# Patient Record
Sex: Female | Born: 1945 | Hispanic: Yes | Smoking: Never smoker
Health system: Southern US, Community
[De-identification: ages and names within clinical notes are randomized; demographics above are authoritative.]

## PROBLEM LIST (undated history)

## (undated) DIAGNOSIS — I10 Essential (primary) hypertension: Secondary | ICD-10-CM

## (undated) DIAGNOSIS — E785 Hyperlipidemia, unspecified: Secondary | ICD-10-CM

## (undated) DIAGNOSIS — M810 Age-related osteoporosis without current pathological fracture: Secondary | ICD-10-CM

## (undated) HISTORY — DX: Age-related osteoporosis without current pathological fracture: M81.0

## (undated) HISTORY — DX: Hyperlipidemia, unspecified: E78.5

## (undated) HISTORY — PX: BREAST EXCISIONAL BIOPSY: SUR124

## (undated) HISTORY — DX: Essential (primary) hypertension: I10

---

## 2016-12-23 ENCOUNTER — Other Ambulatory Visit: Payer: Self-pay | Admitting: Internal Medicine

## 2016-12-23 DIAGNOSIS — Z1231 Encounter for screening mammogram for malignant neoplasm of breast: Secondary | ICD-10-CM

## 2016-12-24 ENCOUNTER — Ambulatory Visit
Admission: RE | Admit: 2016-12-24 | Discharge: 2016-12-24 | Disposition: A | Discharge status: Home / Self Care | Payer: Medicare Other | Source: Ambulatory Visit | Attending: Internal Medicine | Admitting: Internal Medicine

## 2016-12-24 DIAGNOSIS — Z1231 Encounter for screening mammogram for malignant neoplasm of breast: Secondary | ICD-10-CM

## 2016-12-29 ENCOUNTER — Ambulatory Visit: Payer: Self-pay

## 2017-02-04 ENCOUNTER — Ambulatory Visit (INDEPENDENT_AMBULATORY_CARE_PROVIDER_SITE_OTHER): Payer: Medicare Other | Admitting: Neurology

## 2017-02-04 ENCOUNTER — Encounter (INDEPENDENT_AMBULATORY_CARE_PROVIDER_SITE_OTHER): Payer: Self-pay

## 2017-02-04 ENCOUNTER — Encounter: Payer: Self-pay | Admitting: Neurology

## 2017-02-04 VITALS — BP 173/75 | HR 77 | Ht 60.0 in | Wt 142.8 lb

## 2017-02-04 DIAGNOSIS — R413 Other amnesia: Secondary | ICD-10-CM | POA: Diagnosis not present

## 2017-02-04 DIAGNOSIS — R51 Headache: Secondary | ICD-10-CM | POA: Diagnosis not present

## 2017-02-04 DIAGNOSIS — G44229 Chronic tension-type headache, not intractable: Secondary | ICD-10-CM | POA: Diagnosis not present

## 2017-02-04 DIAGNOSIS — G43109 Migraine with aura, not intractable, without status migrainosus: Secondary | ICD-10-CM | POA: Diagnosis not present

## 2017-02-04 DIAGNOSIS — G459 Transient cerebral ischemic attack, unspecified: Secondary | ICD-10-CM

## 2017-02-04 DIAGNOSIS — R419 Unspecified symptoms and signs involving cognitive functions and awareness: Secondary | ICD-10-CM | POA: Diagnosis not present

## 2017-02-04 DIAGNOSIS — R519 Headache, unspecified: Secondary | ICD-10-CM

## 2017-02-04 NOTE — Patient Instructions (Signed)
MRI of the brain Echocardiogram and carotid dopplers [facility] (at primary care 10/15)   Cefalea migraosa (Migraine Headache) Neomia Dear cefalea migraosa es un dolor intenso y punzante en uno o ambos lados de la cabeza. Las migraas tambin pueden causar otros sntomas, como nuseas, vmitos y sensibilidad a la luz y el ruido. CAUSAS Hay ciertos factores que pueden provocar migraas, como los siguientes:  Alcohol.  Fumar.  Medicamentos, por ejemplo: ? Medicamentos para Engineer, materials torcico (nitroglicerina). ? Pldoras anticonceptivas. ? Comprimidos de estrgeno. ? Ciertos medicamentos para la presin arterial.  Quesos curados, chocolate o cafena.  Los alimentos o las bebidas que contienen nitratos, glutamato, aspartamo o tiramina.  Realizar actividad fsica. Otros factores que pueden provocar migraa incluyen los siguientes:  Menstruacin.  Embarazo.  Hambre.  Estrs, poco o demasiado sueo, o fatiga.  Cambios climticos. FACTORES DE RIESGO Los siguientes factores pueden hacer que usted sea ms propenso a tener migraas:  La edad. Los riesgos aumentan con la edad.  Antecedentes familiares de migraa.  Ser de Engineer, manufacturing.  Depresin y ansiedad.  Obesidad.  Ser mujer.  Tener un agujero en el corazn (persistencia del agujero oval) u otros problemas cardacos. SNTOMAS El principal sntoma de esta afeccin es el dolor intenso y punzante. El dolor:  Puede aparecer en cualquier regin de la cabeza, tanto de un lado como de [place].  Puede interferir con las actividades de la vida cotidiana.  Puede empeorar con la actividad fsica.  Puede empeorar ante la exposicin a luces brillantes o a ruidos fuertes. Otros sntomas pueden incluir lo siguiente:  Nuseas.  Vmitos.  Mareos.  Sensibilidad general a las luces brillantes, a los ruidos fuertes o a los [employer]. Antes de sufrir una migraa, puede percibir seales de advertencia (aura). Un aura puede  incluir:  Ver luces intermitentes o tener puntos ciegos.  Ver puntos brillantes, halos o lneas en zigzag.  Tener una visin en tnel o visin borrosa.  Sentir entumecimiento u hormigueo.  Tener dificultad para hablar.  Debilidad muscular. DIAGNSTICO La cefalea migraosa se diagnostica en funcin de lo siguiente:  Sus sntomas.  Un examen fsico.  Estudios, como una tomografa computarizada o una resonancia magntica de la cabeza. Estos estudios de diagnstico por imgenes pueden ayudar a Teacher, early years/pre causas de cefalea.  Lauris Poag de lquido cefalorraqudeo (puncin lumbar) para Chiropractor (anlisis de lquido cefalorraqudeo o anlisis de LCR). TRATAMIENTO Las cefaleas migraosas suelen tratarse con medicamentos que:  Associate Professor.  Alivian las nuseas.  Evitan la recurrencia de las migraas. El tratamiento tambin puede incluir lo siguiente:  Acupuntura.  Cambios en el estilo de vida, como evitar alimentos que provoquen Cerritos. INSTRUCCIONES PARA EL CUIDADO EN EL HOGAR Medicamentos  B[employer] International de venta libre y los recetados solamente como se lo haya indicado el mdico.  No conduzca ni use maquinaria pesada mientras toma analgsicos recetados.  A fin de prevenir o tratar el estreimiento mientras toma analgsicos recetados, el mdico puede recomendarle lo siguiente: ? Beba suficiente lquido para mantener la orina clara o de color amarillo plido. ? Tomar medicamentos recetados o de [employer]. ? Consumir alimentos ricos en fibra, como frutas y verduras frescas, cereales integrales y frijoles. ? Limitar el consumo de alimentos con alto contenido de grasas y azcares procesados, como alimentos fritos o dulces. Estilo de vida  Evite el consumo de alcohol.  No consuma ningn producto que contenga nicotina o tabaco, como cigarrillos y Administrator, Civil Service. Si necesita ayuda para dejar de fumar, consulte  al mdico.  Duerma como mnimo 8horas  todas las noches.  Evite las situaciones de estrs. Instrucciones generales  Lleve un registro diario para Financial risk analyst lo que Advice worker. Por ejemplo, escriba: ? Lo que usted come y bebe. ? Cunto tiempo duerme. ? Algn cambio en su dieta o en los medicamentos.  Si tiene una migraa: ? Evite los factores que [employer] sntomas, como las luces brillantes. ? Resulta til acostarse en una habitacin oscura y silenciosa. ? No conduzca vehculos ni opere maquinaria pesada. ? Pregntele al mdico qu actividades son seguras para usted cuando tiene sntomas.  Concurra a todas las visitas de control como se lo haya indicado el mdico. Esto es importante. SOLICITE ATENCIN MDICA SI:  Tiene sntomas de migraa distintos o ms intensos que los habituales.  SOLICITE ATENCIN MDICA DE INMEDIATO SI:  La migraa se hace cada vez ms intensa.  Tiene fiebre.  Presenta rigidez en el cuello.  Tiene prdida de visin.  Siente debilidad en los msculos o que no puede controlarlos.  Comienza a perder el equilibrio con frecuencia.  Comienza a tener dificultades para caminar.  Se desmaya.  Esta informacin no tiene Theme park manager el consejo del mdico. Asegrese de hacerle al mdico cualquier pregunta que tenga. Document Released: 04/20/2005 Document Revised: 02/08/2013 Document Reviewed: 10/07/2015 Elsevier Interactive Patient Education  2017 Elsevier Inc.  Amnesia global transitoria (Transient Global Amnesia) La amnesia global transitoria causa una prdida de la memoria (amnesia) repentina y temporal (transitoria). A pesar de que puede tener recuerdos de su pasado lejano, que incluyen el reconocimiento de personas a las que conoce bien, es posible que no recuerde eventos ms recientes que sucedieron en los ltimos [place], [place] e incluso en el ao anterior. Un episodio de amnesia global transitoria no dura ms de 24 horas. La amnesia global transitoria no  afecta sus otras funciones cerebrales. La memoria generalmente vuelve a la normalidad despus de que finaliza un episodio. Un episodio de amnesia global transitoria no implica que tiene ms probabilidades de sufrir un ictus, una recada u otras complicaciones. CAUSAS Se desconoce la causa de esta afeccin. FACTORES DE RIESGO Hay mayores probabilidades de que la amnesia global transitoria se desarrolle en personas que:  Tienen entre 50 y 11aos.  Tienen antecedentes de migraa. SNTOMAS Los principales sntomas de esta afeccin incluyen lo siguiente:  La incapacidad de recordar eventos recientes.  Realizar preguntar reiteradas sobre la situacin y el contexto, y no recordar las respuestas a estas preguntas. Otros sntomas pueden ser los siguientes:  Agitacin y nerviosismo.  Confusin.  Dolores de [place].  Mareos.  Nuseas. DIAGNSTICO El mdico puede sospechar esta afeccin segn los sntomas. El [employer] har un examen fsico. Este puede incluir una prueba para comprobar sus habilidades mentales (evaluacin cognitiva). Tambin pueden realizarle estudios por imgenes para controlar las funciones cerebrales. Estos pueden incluir lo siguiente:  Electroencefalografa (EEG).  Imgenes ponderadas por difusin (IPD).  Resonancia magntica. TRATAMIENTO No hay tratamiento para esta afeccin. Un episodio generalmente desaparece por s solo despus de unas horas. Si, durante un episodio, tambin sufre una convulsin o [place], recibir tratamiento para estos sntomas, que puede incluir medicamentos. INSTRUCCIONES PARA EL CUIDADO EN EL HOGAR  Tome los medicamentos solamente como se lo haya indicado el mdico.  Informe a su familia o amigos que tiene amnesia global transitoria. Pdales que le ayuden a Human resources officer fsico, que incluye tener relaciones sexuales, nadar y Education officer, environmental esfuerzos mientras contiene la respiracin (maniobra de [place]), [employer]  que finalice el episodio. Estos  eventos pueden desencadenar crisis de amnesia global transitoria. SOLICITE ATENCIN MDICA SI:  Aram Candela y esta no desaparece despus de que haya seguido el plan de tratamiento para esta afeccin.  Tiene una convulsin por primera vez o una convulsin diferente a las que normalmente padece.  Tiene amnesia global transitoria en reiteradas ocasiones. Esta informacin no tiene Theme park manager el consejo del mdico. Asegrese de hacerle al mdico cualquier pregunta que tenga. Document Released: 04/20/2005 Document Revised: 05/11/2014 Document Reviewed: 01/03/2014 Elsevier Interactive Patient Education  2018 [employer].     Ictus isqumico (Ischemic Stroke) Un ictus isqumico (accidente cerebrovascular o ACV) es la muerte repentina de tejido cerebral que ocurre cuando el oxgeno no llega a una zona del cerebro. Es una emergencia mdica que debe tratarse de inmediato. Un ictus isqumico puede causar una prdida permanente de la actividad cerebral. Esto puede causar problemas con el funcionamiento de diferentes partes del cuerpo. CAUSAS Esta afeccin es causada por la falta de oxgeno en una zona del cerebro, que puede ser el resultado de lo siguiente:  Un pequeo cogulo sanguneo (mbolos) o una acumulacin de placas en los vasos sanguneos (ateroesclerosis) que bloqueen el torrente sanguneo en el cerebro.  Ritmo cardaco anormal (fibrilacin auricular).  Una arteria obstruida o daada en la cabeza o el cuello. FACTORES DE RIESGO Hay ciertos factores que pueden hacer que sea ms propenso a sufrir esta afeccin. Algunos de [employer] puede [place], como por ejemplo:  [place].  Fumar cigarrillos.  Tomar anticonceptivos por va oral, en especial si consume tabaco.  Falta de actividad fsica.  Consumo excesivo de bebidas alcohlicas.  Consumo de drogas ilegales, especialmente cocana y metanfetamina. Otros factores de riesgo son los siguientes:  Presin  arterial elevada (hipertensin arterial).  Colesterol elevado.  Diabetes mellitus.  Cardiopata coronaria.  Ser afroamericano, norteamericano nativo, hispano o nativo de [place].  Ser mayor de 56aos.  Tener antecedentes familiares de ictus.  Tener antecedentes de cogulos sanguneos, ictus o ataques isqumicos transitorios (AIT).  Anemia drepanoctica.  Ser mujer con antecedentes de preeclampsia.  Cefalea migraosa.  Apnea del sueo.  Tener un ritmo cardaco irregular, como fibrilacin auricular.  Tener enfermedades inflamatorias crnicas, como artritis reumatoide o lupus.  Trastornos de Control and instrumentation engineer). SIGNOS Y SNTOMAS Los sntomas de esta afeccin, por lo general, aparecen de forma repentina, o puede notarlos despus de despertarse. Entre los sntomas repentinos se pueden incluir los siguientes:  Debilidad o adormecimiento de la cara, el brazo o la pierna, especialmente en un lado del cuerpo.  Dificultad para caminar, o para mover los brazos o las piernas.  Prdida del equilibrio o de la coordinacin.  Confusin.  Habla arrastrada (disartria).  Dificultad para hablar o comprender el lenguaje, o ambas (afasia).  Cambios en la visin (como visin doble, visin borrosa o prdida de la visin) enuno o ambos ojos.  Mareos.  Nuseas y vmitos.  Dolor de cabeza intenso sin causa aparente. Dolor de cabeza que generalmente se describe Curator de cabeza que haya sufrido. En lo posible, tome nota de la hora exacta en la que se sinti normal por ltima vez y de la hora en que comenzaron los sntomas. Infrmele a su mdico. Si los sntomas van y vienen, esto podra ser un signo de ictus de advertencia o AIT. Busque ayuda de inmediato, incluso si se siente mejor. DIAGNSTICO Esta afeccin se puede diagnosticar en funcin de lo siguiente:  Los sntomas, sus antecedentes mdicos y  un examen fsico.  Tomografa computarizada (TC) del  cerebro.  Resonancia magntica (RM).  Angiografa por tomografa computarizada (TC). En esta prueba, se Beryle Quant computadora para tomarle radiografas de las arterias. Pueden inyectarle un colorante en la sangre para observar el interior de los vasos sanguneos con ms claridad.  Angiografa por resonancia magntica (RM). Se trata de un tipo de resonancia magntica que se [place] para estudiar los vasos sanguneos.  Angiografa cerebral. En este estudio se utilizan rayosX y un colorante para observar los vasos sanguneos del cerebro y el cuello. Tal vez deba consultar a un mdico especialista en ictus. Puede consultar a Environmental manager, por telfono o mediante tecnologa a distancia (telemedicina). Se pueden realizar otros estudios para hallar la causa del ictus, que pueden incluir los siguientes:  Materials engineer (ECG).  Monitorizacin electrocardiogrfica continua.  Ecocardiograma.  Ecografa de la cartida.  Estudio de la circulacin cerebral.  Anlisis de [place].  Estudio del sueo para verificar si tiene apnea del sueo. TRATAMIENTO El tratamiento de esta afeccin depender de la duracin, la gravedad y la causa de los sntomas, y de la zona del cerebro afectada. Es muy importante recibir tratamiento tras aparecer los primeros sntomas del ictus. Algunos tratamientos resultan ms eficaces si se realizan en el plazo de las 3 a 6horas del comienzo de los sntomas del ictus. Estos tratamientos pueden incluir los siguientes:  Aspirina.  Medicamentos para controlar la presin arterial.  Un medicamento inyectable para disolver el cogulo sanguneo (tromboltico).  Tratamientos aplicados directamente en la arteria afectada para eliminar o disolver el cogulo sanguneo. Otras opciones de tratamiento son las siguientes:  Oxgeno.  Lquidos por va IV.  Medicamentos para diluir la sangre (anticoagulantes o inhibidores plaquetarios).  Procedimientos para aumentar  el flujo sanguneo. La administracin de medicamentos y los cambios en la dieta pueden ayudar a tratar y Chief Operating Officer los factores de riesgo de ictus, como la diabetes, el colesterol alto y la hipertensin arterial. Despus de un ictus, puede trabajar con fisioterapeutas, fonoaudilogos o terapeutas ocupacionales para ayudarlo a recuperarse. INSTRUCCIONES PARA EL CUIDADO EN EL HOGAR Medicamentos  [employer] de venta libre y los recetados solamente como se lo haya indicado el mdico.  Si le indicaron que tomara medicamentos para diluir la sangre, como aspirinas o anticoagulantes, tmelos exactamente como se lo haya indicado el mdico. ? El exceso de anticoagulantes puede causar hemorragias. ? Si no toma la cantidad suficiente, no contar con la proteccin que necesita contra otro ictus y [employer].  Conozca los efectos secundarios de tomar anticoagulantes. Al tomar este tipo de medicamentos, asegrese de lo siguiente: ? Occupational hygienist presin sobre las heridas por ms tiempo que lo habitual. ? Informe a su dentista y otros mdicos que toma anticoagulantes antes de que le realicen alguna intervencin quirrgica que pueda causar hemorragias. ? Evite realizar actividades que puedan causarle traumatismos o lesiones. Comida y bebida  Siga las indicaciones del mdico acerca de la dieta.  Consuma alimentos saludables.  Si el ictus afect su capacidad para tragar, es posible que necesite tomar medidas para no ahogarse, como las siguientes: ? Comer de a porciones pequeas. ? Comer comidas blandas o en pur. Seguridad  Siga las indicaciones del equipo mdico con respecto a la actividad fsica.  Use un andador o un bastn como se lo haya indicado el mdico.  Tome medidas para crear un entorno seguro en su casa a fin de reducir el riesgo de cadas. Esto puede incluir lo siguiente: ? Hacer que profesionales inspeccionen  su casa. ? Colocar barras para sostn en la habitacin y el  bao. ? Colocar inodoros elevados y un asiento en la ducha como medidas de seguridad. Instrucciones generales  No consuma ningn producto que contenga tabaco, lo que incluye cigarrillos, tabaco de Theatre manager y Administrator, Civil Service. Si necesita ayuda para dejar de fumar, consulte al mdico.  Limite el consumo de alcohol a no ms de 1 medida por da si es mujer y no est Orthoptist, y 2 medidas por da si es hombre. Una medida equivale a 12onzas de cerveza, 5onzas de vino o 1onzas de bebidas alcohlicas de alta graduacin.  Si necesita ayuda para dejar de consumir drogas o alcohol, pdale al mdico que le recomiende un programa o que lo derive a Music therapist.  Mantenga un estilo de vida activo y saludable. Haga actividad fsica habitualmente como se lo haya indicado el mdico.  Acuda a todas las consultas de control como se lo haya indicado el mdico, incluidas las consultas con todos los especialistas de su equipo mdico. Esto es importante. PREVENCIN El riesgo de sufrir otro ictus puede disminuir al tratar, de manera adecuada, la hipertensin arterial, el colesterol alto, la diabetes, las cardiopatas coronarias, la apnea del sueo y la obesidad. Tambin puede disminuir si deja de fumar, limita el consumo de alcohol y se Audiological scientist. El mdico trabajar con usted para tomar medidas a fin de evitar las complicaciones del ictus a corto y a Air cabin crew. SOLICITE ATENCIN MDICA DE INMEDIATO SI: Tiene los siguientes sntomas:  Tiene debilidad o adormecimiento sbito en el rostro, el brazo o la pierna, especialmente en un lado del cuerpo.  Confusin sbita.  Dificultad repentina para hablar o comprender el lenguaje, o ambas (afasia).  Dificultad repentina para ver con uno o ambos ojos.  Dificultad repentina para caminar o para mover los brazos o las piernas.  Mareos repentinos.  Prdida repentina del equilibrio o de la coordinacin.  Dolor de cabeza sbito e  intenso sin causa aparente.  Prdida parcial o total del conocimiento.  Convulsiones. Cualquiera de estos sntomas puede representar un problema grave y es Radio broadcast assistant. No espere hasta que los sntomas desaparezcan. Solicite atencin mdica de inmediato. Comunquese con el servicio de emergencias de su localidad (911 en los [place]). No conduzca por sus propios medios [employer]. Esta informacin no tiene Theme park manager el consejo del mdico. Asegrese de hacerle al mdico cualquier pregunta que tenga. Document Released: 01/28/2005 Document Revised: 05/11/2014 Document Reviewed: 07/17/2015 Elsevier Interactive Patient Education  2017 [employer].  [employer] adultos (Seizure, Adult) Neomia Dear convulsin es una rfaga repentina de actividad elctrica anormal en el cerebro. Esta actividad anormal interrumpe el funcionamiento normal del cerebro de forma temporal, y la persona puede experimentar cualquiera de los siguientes signos:  Movimientos involuntarios.  Cambios en el estado de alerta o la conciencia.  Sacudidas incontrolables (convulsiones). Generalmente duran entre 30 segundos y 2 minutos. No suelen causar un dao permanente en el cerebro, salvo que se prolonguen. Qu puede provocar una convulsin? La convulsiones pueden ocurrir por [employer], que incluyen lo siguiente:  Grant Ruts.  Bajo nivel de Banker.  Un medicamento.  Una enfermedad.  Una lesin cerebral. Algunas personas que tienen una convulsin nunca ms tienen [place]. Las personas que tienen convulsiones reiteradas, tienen una afeccin llamada epilepsia. Cules son los sntomas de una convulsin? Los sntomas de una convulsin varan mucho de Neomia Dear persona a Educational psychologist. Estos incluyen los siguientes:  Espasmos.  El  cuerpo se entumece.  Movimientos involuntarios de los brazos o las piernas.  Prdida del conocimiento.  Problemas respiratorios.  Cada  repentina.  Confusin.  Movimientos de asentimiento con la cabeza.  Parpadeo o movimientos de abrir y Retail buyer ojos.  Chasquido de labios.  Babeo.  Movimientos rpidos de los ojos.  Gruidos.  Prdida del control del intestino y de la vejiga.  Mirar fijamente.  Falta de [place]. Algunas personas tienen sntomas apenas antes de que ocurra una convulsin (aura) e inmediatamente despus de la convulsin. Los sntomas del aura incluyen los siguientes:  [place] o ansiedad.  Nuseas.  Sentir que la habitacin da vueltas (vrtigo).  Una sensacin de haber visto o escuchado algo antes (deja vu).  Sabores u [employer].  Cambios en la visin, como ver destellos de luz o [place]. Los sntomas que pueden ocurrir despus de una convulsin incluyen los siguientes:  Confusin.  Somnolencia.  Dolor de [place].  Debilidad en un lado del cuerpo. INSTRUCCIONES PARA EL CUIDADO EN EL HOGAR Medicamentos  Baxter International de venta libre y los recetados solamente como se lo haya indicado el mdico.  Evite cualquier sustancia que pueda interferir con su medicamento, como el alcohol. Actividad  No conduzca, nade ni haga ninguna otra actividad que pueda ser peligrosa si tuvo otra convulsin. Espere hasta que el mdico lo autorice.  Si vive en los [place], consulte al Departamento de [employer] Motorizados local para averiguar sobre las leyes de trnsito locales. Cada estado tiene normas especficas sobre cundo puede volver a Public house manager.  Descanse lo suficiente. La falta de sueo puede aumentar la probabilidad de sufrir convulsiones. Educar a los [employer] a sus amigos y familiares lo que deben hacer si tiene una convulsin. Ellos deben:  Acostarlo en el suelo para evitar que se caiga.  Colocar un almohadn debajo de su cabeza y cuerpo.  Aflojar la ropa apretada alrededor de su cuello.  Recostarlo sobre un lado. En caso de tener vmitos, esto ayuda a  [employer] vas areas despejadas.  Lennie Hummer con usted hasta que se recupere.  No sostenerlo presionando hacia abajo. Hacer esto no detendr la convulsin.  No colocarle nada en la boca.  Saber si necesita atencin de emergencia o no. Instrucciones generales  Comunquese con el mdico cada vez que tenga una convulsin.  Evite cualquier cosa que le haya desencadenado una convulsin.  Lleve un diario de sus convulsiones. Registre lo que recuerde [employer], en especial, cualquier cosa que pueda haber desencadenado la convulsin.  Concurra a todas las visitas de control como se lo haya indicado el mdico. Esto es importante. SOLICITE ATENCIN MDICA SI:  Tiene otra convulsin.  Tiene convulsiones con mayor frecuencia.  Los sntomas de sus convulsiones [place].  Contina teniendo convulsiones despus con Scientist, research (medical).  Tiene sntomas de alguna infeccin o enfermedad. Esto podra aumentar el riesgo de tener una convulsin.  SOLICITE ATENCIN MDICA DE INMEDIATO SI:  Tiene una convulsin con las siguientes caractersticas: ? Dura ms de . ? Es diferente de las [employer]. ? No le permite hablar ni usar una parte de su cuerpo. ? Dificulta la respiracin. ? Ocurre despus de una lesin en la cabeza.  Tiene los siguientes sntomas: ? Varias convulsiones consecutivas. ? Confusin o dolor de cabeza intenso inmediatamente despus de una convulsin.  Tiene convulsiones con mayor frecuencia.  No se despierta de inmediato despus de una convulsin.  Se lesiona durante una convulsin. Estos sntomas pueden representar un problema grave que constituye  una emergencia. No espere hasta que los sntomas desaparezcan. Solicite atencin mdica de inmediato. Comunquese con el servicio de emergencias de su localidad (911 en los [place]). No conduzca por sus propios medios [employer]. Esta informacin no tiene Theme park manager el consejo  del mdico. Asegrese de hacerle al mdico cualquier pregunta que tenga. Document Released: 01/28/2005 Document Revised: 05/11/2014 Document Reviewed: 11/22/2015 Elsevier Interactive Patient Education  2017 [employer].   Ischemic Stroke An ischemic stroke (cerebrovascular accident, or CVA) is the sudden death of brain tissue that occurs when an area of the brain does not get enough oxygen. It is a medical emergency that must be treated right away. An ischemic stroke can cause permanent loss of brain function. This can cause problems with how different parts of your body function. What are the causes? This condition is caused by a decrease of oxygen supply to an area of the brain, which may be the result of:  A small blood clot (embolus) or a buildup of plaque in the blood vessels (atherosclerosis) that blocks blood flow in the brain.  An abnormal heart rhythm (atrial fibrillation).  A blocked or damaged artery in the head or neck.  What increases the risk? Certain factors may make you more likely to develop this condition. Some of these factors are things that you can change, such as:  Obesity.  Smoking cigarettes.  Taking oral birth control, especially if you also use tobacco.  Physical inactivity.  Excessive alcohol use.  Use of illegal drugs, especially cocaine and methamphetamine.  Other risk factors include:  High blood pressure (hypertension).  High cholesterol.  Diabetes mellitus.  Heart disease.  Being [place] American, Native [address], Hispanic, or [place] Native.  Being over age 69.  Family history of stroke.  Previous history of blood clots, stroke, or transient ischemic attack (TIA).  Sickle cell disease.  Being a woman with a history of preeclampsia.  Migraine headache.  Sleep apnea.  Irregular heartbeats, such as atrial fibrillation.  Chronic inflammatory diseases, such as rheumatoid arthritis or lupus.  Blood clotting disorders  (hypercoagulable state).  What are the signs or symptoms? Symptoms of this condition usually develop suddenly, or you may notice them after waking up from sleep. Symptoms may include sudden:  Weakness or numbness in your face, arm, or leg, especially on one side of your body.  Trouble walking or difficulty moving your arms or legs.  Loss of balance or coordination.  Confusion.  Slurred speech (dysarthria).  Trouble speaking, understanding speech, or both (aphasia).  Vision changes-such as double vision, blurred vision, or loss of vision-inone or both eyes.  Dizziness.  Nausea and vomiting.  Severe headache with no known cause. The headache is often described as the worst headache ever experienced.  If possible, make note of the exact time that you last felt like your normal self and what time your symptoms started. Tell your health care provider. If symptoms come and go, this could be a sign of a warning stroke, or TIA. Get help right away, even if you feel better. How is this diagnosed? This condition may be diagnosed based on:  Your symptoms, your medical history, and a physical exam.  CT scan of the brain.  MRI.  CT angiogram. This test uses a computer to take X-rays of your arteries. A dye may be injected into your blood to show the inside of your blood vessels more clearly.  MRI angiogram. This is a type of MRI that is used  to evaluate the blood vessels.  Cerebral angiogram. This test uses X-rays and a dye to show the blood vessels in the brain and neck.  You may need to see a health care provider who specializes in stroke care. A stroke specialist can be seen in person or through communication using telephone or television technology (telemedicine). Other tests may also be done to find the cause of the stroke, such as:  Electrocardiogram (ECG).  Continuous heart monitoring.  Echocardiogram.  Carotid ultrasound.  A scan of the brain circulation.  Blood  tests.  Sleep study to check for sleep apnea.  How is this treated? Treatment for this condition will depend on the duration, severity, and cause of your symptoms and on the area of the brain affected. It is very important to get treatment at the first sign of stroke symptoms. Some treatments work better if they are done within 3-6 hours of the onset of stroke symptoms. These initial treatments may include:  Aspirin.  Medicines to control blood pressure.  Medicine given by injection to dissolve the blood clot (thrombolytic).  Treatments given directly to the affected artery to remove or dissolve the blood clot.  Other treatment options may include:  Oxygen.  IV fluids.  Medicines to thin the blood (anticoagulants or antiplatelets).  Procedures to increase blood flow.  Medicines and changes to your diet may be used to help treat and manage risk factors for stroke, such as diabetes, high cholesterol, and high blood pressure. After a stroke, you may work with physical, speech, mental health, or occupational therapists to help you recover. Follow these instructions at home: Medicines  Take over-the-counter and prescription medicines only as told by your health care provider.  If you were told to take a medicine to thin your blood, such as aspirin or an anticoagulant, take it exactly as told by your health care provider. ? Taking too much blood-thinning medicine can cause bleeding. ? If you do not take enough blood-thinning medicine, you will not have the protection that you need against another stroke and other problems.  Understand the side effects of taking anticoagulant medicine. When taking this type of medicine, make sure you: ? Hold pressure over any cuts for longer than usual. ? Tell your dentist and other health care providers that you are taking anticoagulants before you have any procedures that may cause bleeding. ? Avoid activities that may cause trauma or  injury. Eating and drinking  Follow instructions from your health care provider about diet.  Eat healthy foods.  If your ability to swallow was affected by the stroke, you may need to take steps to avoid choking, such as: ? Taking small bites when eating. ? Eating foods that are soft or pureed. Safety  Follow instructions from your health care team about physical activity.  Use a walker or cane as told by your health care provider.  Take steps to create a safe home environment in order to reduce the risk of falls. This may include: ? Having your home looked at by specialists. ? Installing grab bars in the bedroom and bathroom. ? Using safety equipment, such as raised toilets and a seat in the shower. General instructions  Do not use any tobacco products, such as cigarettes, chewing tobacco, and e-cigarettes. If you need help quitting, ask your health care provider.  Limit alcohol intake to no more than 1 drink a day for nonpregnant women and 2 drinks a day for men. One drink equals 12 oz of beer,  5 oz of wine, or 1 oz of hard liquor.  If you need help to stop using drugs or alcohol, ask your health care provider about a referral to a program or specialist.  Maintain an active and healthy lifestyle. Get regular exercise as told by your health care provider.  Keep all follow-up visits as told by your health care provider, including visits with all specialists on your health care team. This is important. How is this prevented? Your risk of another stroke can be decreased by managing high blood pressure, high cholesterol, diabetes, heart disease, sleep apnea, and obesity. It can also be decreased by quitting smoking, limiting alcohol, and staying physically active. Your health care provider will continue to work with you on measures to prevent short-term and long-term complications of stroke. Get help right away if: You have:  Sudden weakness or numbness in your face, arm, or leg,  especially on one side of your body.  Sudden confusion.  Sudden trouble speaking, understanding, or both (aphasia).  Sudden trouble seeing with one or both eyes.  Sudden trouble walking or difficulty moving your arms or legs.  Sudden dizziness.  Sudden loss of balance or coordination.  Sudden, severe headache with no known cause.  A partial or total loss of consciousness.  A seizure. Any of these symptoms may represent a serious problem that is an emergency. Do not wait to see if the symptoms will go away. Get medical help right away. Call your local emergency services (911 in [place] Do not drive yourself to the hospital. This information is not intended to replace advice given to you by your health care provider. Make sure you discuss any questions you have with your health care provider. Document Released: 04/20/2005 Document Revised: 10/01/2015 Document Reviewed: 07/17/2015 Elsevier Interactive Patient Education  2017 [employer].  Seizure, Adult A seizure is a sudden burst of abnormal electrical activity in the brain. The abnormal activity temporarily interrupts normal brain function, causing a person to experience any of the following:  Involuntary movements.  Changes in awareness or consciousness.  Uncontrollable shaking (convulsions).  Seizures usually last from 30 seconds to 2 minutes. They usually do not cause permanent brain damage unless they are prolonged. What can cause a seizure to happen? Seizures can happen for many reasons including:  A fever.  Low blood sugar.  A medicine.  An illnesses.  A brain injury.  Some people who have a seizure never have another one. People who have repeated seizures have a condition called epilepsy. What are the symptoms of a seizure? Symptoms of a seizure vary greatly from person to person. They include:  Convulsions.  Stiffening of the body.  Involuntary movements of the arms or legs.  Loss of  consciousness.  Breathing problems.  Falling suddenly.  Confusion.  Head nodding.  Eye blinking or fluttering.  Lip smacking.  Drooling.  Rapid eye movements.  Grunting.  Loss of bladder control and bowel control.  Staring.  Unresponsiveness.  Some people have symptoms right before a seizure happens (aura) and right after a seizure happens. Symptoms of an aura include:  Fear or anxiety.  Nausea.  Feeling like the room is spinning (vertigo).  A feeling of having seen or heard something before (deja vu).  Odd tastes or smells.  Changes in vision, such as seeing flashing lights or spots.  Symptoms that may follow a seizure include:  Confusion.  Sleepiness.  Headache.  Weakness of one side of the body.  Follow these instructions  at home: Medicines   Take over-the-counter and prescription medicines only as told by your health care provider.  Avoid any substances that may prevent your medicine from working properly, such as alcohol. Activity  Do not drive, swim, or do any other activities that would be dangerous if you had another seizure. Wait until your health care provider approves.  If you live in the [place] check with your local DMV (department of motor vehicles) to find out about the local driving laws. Each state has specific rules about when you can legally return to driving.  Get enough rest. Lack of sleep can make seizures more likely to occur. Educating others Teach friends and family what to do if you have a seizure. They should:  Lay you on the ground to prevent a fall.  Cushion your head and body.  Loosen any tight clothing around your neck.  Turn you on your side. If vomiting occurs, this helps keep your airway clear.  Stay with you until you recover.  Not hold you down. Holding you down will not stop the seizure.  Not put anything in your mouth.  Know whether or not you need emergency care.  General instructions  Contact  your health care provider each time you have a seizure.  Avoid anything that has ever triggered a seizure for you.  Keep a seizure diary. Record what you remember about each seizure, especially anything that might have triggered the seizure.  Keep all follow-up visits as told by your health care provider. This is important. Contact a health care provider if:  You have another seizure.  You have seizures more often.  Your seizure symptoms change.  You continue to have seizures with treatment.  You have symptoms of an infection or illness. They might increase your risk of having a seizure. Get help right away if:  You have a seizure: ? That lasts longer than 5 minutes. ? That is different than previous seizures. ? That leaves you unable to speak or use a part of your body. ? That makes it harder to breathe. ? After a head injury.  You have: ? Multiple seizures in a row. ? Confusion or a severe headache right after a seizure.  You are having seizures more often.  You do not wake up immediately after a seizure.  You injure yourself during a seizure. These symptoms may represent a serious problem that is an emergency. Do not wait to see if the symptoms will go away. Get medical help right away. Call your local emergency services (911 in the [place] Do not drive yourself to the hospital. This information is not intended to replace advice given to you by your health care provider. Make sure you discuss any questions you have with your health care provider. Document Released: 04/17/2000 Document Revised: 12/15/2015 Document Reviewed: 11/22/2015 Elsevier Interactive Patient Education  2017 [employer].

## 2017-02-04 NOTE — Progress Notes (Signed)
[facility]    Provider:  Dr Lucia Gaskins Referring Provider: Pearson Grippe, MD Primary Care Physician:  Pearson Grippe, MD  CC:  Headache and loss of awareness  HPI:  Rita Zimmerman is a 71 y.o. female here as a referral from Dr. Selena Batten for headache. PMHx hypertension. Here with son and interpreter. She says she associated her headache with stress, worries and being scared. She had an episode where she lost her memory for 3-4 hours. Son says the memory loss was worrisome. The day of the memory loss, she started talking nonsense, she was having a conversation and she started talking nonsense. At 3pm the son was called by his brother, she wa asking the same question over and over, repeatedly asking the same question. She knew who the kids were, lasted for 3 hours. She was working in [employer]. She doesn't remember but she was told she was unhappy with some work a Surveyor, minerals was performing. She never regained the memory of those 3 hours. She forgot that he granddaughter had surgery but she forgot and kept asking why no one saw her. She had a headache afterwards, the rest of the day and at night.It was a normal day, no increased stress. No hx of seizures or in the family. No hx of strokes. Father, son and brother died from heart disease. Headache is in the temples and in the back of the head, throbbing. Can last 3 days. She takes alleve The headaches can last days. 10 days a month of headaches and will take one alleve on those days. The last time she had a headache was 2x in September. No light sensitivity, no nausea or vomiting, She gets blurry vision and uses eyedrops. Headaches have been ongoing and worsening. Memory loss happened Aug 7th. Has not occurred again. She did have a severe headache that day.  Review of Systems: Patient complains of symptoms per HPI as well as the following symptoms: memory loss, headache. Pertinent negatives and positives per HPI. All others negative.   Social  History   Social History  . Marital status: Unknown    Spouse name: N/A  . Number of children: N/A  . Years of education: N/A   Occupational History  . Not on file.   Social History Main Topics  . Smoking status: Never Smoker  . Smokeless tobacco: Never Used  . Alcohol use No  . Drug use: No  . Sexual activity: Not on file   Other Topics Concern  . Not on file   Social History Narrative  . No narrative on file    History reviewed. No pertinent family history.  Past Medical History:  Diagnosis Date  . Hypertension   . Osteoporosis     Past Surgical History:  Procedure Laterality Date  . BREAST EXCISIONAL BIOPSY Left     Current Outpatient Prescriptions  Medication Sig Dispense Refill  . alendronate (FOSAMAX) 70 MG tablet Take 70 mg by mouth once a week. Take with a full glass of water on an empty stomach.    . hydrochlorothiazide (HYDRODIURIL) 25 MG tablet Take 25 mg by mouth daily.     No current facility-administered medications for this visit.     Allergies as of 02/04/2017  . (No Known Allergies)    Vitals: BP (!) 173/75 (BP Location: Right Arm, Patient Position: Sitting, Cuff Size: Normal)   Pulse 77   Ht 5' (1.524 m)   Wt 142 lb 12.8 oz (64.8 kg)   BMI 27.89 kg/m  Last Weight:  Wt Readings from Last 1 Encounters:  02/04/17 142 lb 12.8 oz (64.8 kg)   Last Height:   Ht Readings from Last 1 Encounters:  02/04/17 5' (1.524 m)    Physical exam: Exam: Gen: NAD, conversant, well nourised,  well groomed                     CV: RRR, no MRG. No Carotid Bruits. No peripheral edema, warm, nontender Eyes: Conjunctivae clear without exudates or hemorrhage  Neuro: Detailed Neurologic Exam  Speech:    Speech is normal; fluent and spontaneous with normal comprehension.  Cognition:    The patient is oriented to person, place, and time;     recent and remote memory intact;     language fluent;     normal attention, concentration,     fund of  knowledge Cranial Nerves:    The pupils are equal, round, and reactive to light. The fundi are normal and spontaneous venous pulsations are present. Visual fields are full to finger confrontation. Extraocular movements are intact. Trigeminal sensation is intact and the muscles of mastication are normal. The face is symmetric. The palate elevates in the midline. Hearing intact. Voice is normal. Shoulder shrug is normal. The tongue has normal motion without fasciculations.   Coordination:    Normal finger to nose and heel to shin. Normal rapid alternating movements.   Gait:    Heel-toe and tandem gait are normal.   Motor Observation:    No asymmetry, no atrophy, and no involuntary movements noted. Tone:    Normal muscle tone.    Posture:    Posture is normal. normal erect    Strength:    Strength is V/V in the upper and lower limbs.      Sensation: intact to LT     Reflex Exam:  DTR's:    Deep tendon reflexes in the upper and lower extremities are normal bilaterally.   Toes:    The toes are downgoing bilaterally.   Clonus:    Clonus is absent     Assessment/Plan:  47 year old her with son and interpreter for several hours loss of memory and alteration of awareness. Also with chronic tension type headaches and possible migraine. Seizures vs stroke/TIA vs transient global amnesia vs complicated migraine.  - New onset headache after the age of 36, alteration of awareness : MRI brain w/wo contrast to eval for seizure focus, mass, stoke or other intracranial lesion - Patient has labs scheduled 10/15 with pcp including cbc, cmp, lipid panel and hgba1c, requested results when available - TIA or stroke: needs a stroke evaluation including MRI brain, MRA head, carotid dopplers and echocadiogram  - EEG  Orders Placed This Encounter  Procedures  . MR BRAIN W WO CONTRAST  . MR MRA HEAD WO CONTRAST  . ECHOCARDIOGRAM COMPLETE BUBBLE STUDY  . EEG  VAS US CAROTID  Cc: Dr. Joline Maxcy, MD  [facility] [address] [place], [place] [address]  Phone [phone] Fax [phone]

## 2017-02-05 ENCOUNTER — Telehealth: Payer: Self-pay | Admitting: Neurology

## 2017-02-05 DIAGNOSIS — G43909 Migraine, unspecified, not intractable, without status migrainosus: Secondary | ICD-10-CM | POA: Insufficient documentation

## 2017-02-05 DIAGNOSIS — G44209 Tension-type headache, unspecified, not intractable: Secondary | ICD-10-CM | POA: Insufficient documentation

## 2017-02-05 NOTE — Telephone Encounter (Signed)
Toma Copier, would you please make a note to request lab results after 10/15 for patient? Need all labs and then will need to also give results to imaging facility for her contrast. thanks

## 2017-02-09 NOTE — Telephone Encounter (Signed)
Set reminder task to obtain labs for Dr. Lucia Gaskins.

## 2017-02-16 ENCOUNTER — Ambulatory Visit (HOSPITAL_COMMUNITY): Payer: Medicare Other | Attending: Neurology

## 2017-02-16 ENCOUNTER — Other Ambulatory Visit: Payer: Self-pay

## 2017-02-16 DIAGNOSIS — G459 Transient cerebral ischemic attack, unspecified: Secondary | ICD-10-CM | POA: Diagnosis not present

## 2017-02-16 DIAGNOSIS — R419 Unspecified symptoms and signs involving cognitive functions and awareness: Secondary | ICD-10-CM | POA: Diagnosis present

## 2017-02-16 NOTE — Progress Notes (Unsigned)
I appreciate Rich Reining from [employer] for interpreting.

## 2017-02-18 ENCOUNTER — Ambulatory Visit (INDEPENDENT_AMBULATORY_CARE_PROVIDER_SITE_OTHER): Payer: Medicare Other

## 2017-02-18 DIAGNOSIS — R410 Disorientation, unspecified: Secondary | ICD-10-CM | POA: Diagnosis not present

## 2017-02-18 DIAGNOSIS — R51 Headache: Principal | ICD-10-CM

## 2017-02-18 DIAGNOSIS — R519 Headache, unspecified: Secondary | ICD-10-CM

## 2017-02-18 NOTE — Procedures (Signed)
    History:  Rita Zimmerman is a 71 year old patient with a history of headaches and some problems with memory. The patient had an episode where she was amnestic for about 4 hours. She was talking nonsense during the event, she was repeatedly asking the same questions. The patient is being evaluated for this event.  This is a routine EEG. No skull defects are noted. Medications include Fosamax and hydrochlorothiazide.   EEG classification: Normal awake  Description of the recording: The background rhythms of this recording consists of a fairly well modulated medium amplitude alpha rhythm of 9 Hz that is reactive to eye opening and closure. As the record progresses, the patient appears to remain in the waking state throughout the recording. Photic stimulation was performed, resulting in a bilateral and symmetric photic driving response. Hyperventilation was also performed, resulting in a minimal buildup of the background rhythm activities without significant slowing seen. At no time during the recording does there appear to be evidence of spike or spike wave discharges or evidence of focal slowing. EKG monitor shows no evidence of cardiac rhythm abnormalities with a heart rate of 72.  Impression: This is a normal EEG recording in the waking state. No evidence of ictal or interictal discharges are seen.

## 2017-02-19 ENCOUNTER — Telehealth: Payer: Self-pay

## 2017-02-19 ENCOUNTER — Telehealth: Payer: Self-pay | Admitting: *Deleted

## 2017-02-19 NOTE — Telephone Encounter (Signed)
I spoke with patient's son Efrain, ok per dpr. He is aware of these results and voiced understanding. I confirmed with him that her PCP is Dr. Renelda LomaJames King and I have faxed a copy of these results.

## 2017-02-19 NOTE — Telephone Encounter (Signed)
[place] son[place] on [place],  who speaks English and informed him his mother's EEG was normal . Advised him Dr Lucia GaskinsAhern will wait until she completes further testing. He will get a call with those results. He stated he would tell his mother, had no questions, verbalized understanding, appreciation.

## 2017-02-19 NOTE — Telephone Encounter (Signed)
-----   Message from Anson FretAntonia B Ahern, MD sent at 02/16/2017  6:32 PM EDT ----- Echocardiogram was unremarkable, no cause for her symptoms. However it may show that she has some high blood pressure and she needs to follow up with primary care and review these results and discuss them. Would you fax over a copy to her primary care, please ask her who this is and fax it and document that it has been faxed.  thanks

## 2017-02-22 ENCOUNTER — Ambulatory Visit (HOSPITAL_COMMUNITY)
Admission: RE | Admit: 2017-02-22 | Discharge: 2017-02-22 | Disposition: A | Discharge status: Home / Self Care | Payer: Medicare Other | Source: Ambulatory Visit | Attending: Cardiology | Admitting: Cardiology

## 2017-02-22 DIAGNOSIS — G459 Transient cerebral ischemic attack, unspecified: Secondary | ICD-10-CM | POA: Diagnosis not present

## 2017-02-22 DIAGNOSIS — Z8673 Personal history of transient ischemic attack (TIA), and cerebral infarction without residual deficits: Secondary | ICD-10-CM | POA: Insufficient documentation

## 2017-02-22 DIAGNOSIS — R419 Unspecified symptoms and signs involving cognitive functions and awareness: Secondary | ICD-10-CM | POA: Insufficient documentation

## 2017-02-22 DIAGNOSIS — Z87891 Personal history of nicotine dependence: Secondary | ICD-10-CM | POA: Insufficient documentation

## 2017-02-23 ENCOUNTER — Ambulatory Visit
Admission: RE | Admit: 2017-02-23 | Discharge: 2017-02-23 | Disposition: A | Discharge status: Home / Self Care | Payer: Medicare Other | Source: Ambulatory Visit | Attending: Neurology | Admitting: Neurology

## 2017-02-23 DIAGNOSIS — R419 Unspecified symptoms and signs involving cognitive functions and awareness: Secondary | ICD-10-CM

## 2017-02-23 DIAGNOSIS — R519 Headache, unspecified: Secondary | ICD-10-CM

## 2017-02-23 DIAGNOSIS — R413 Other amnesia: Secondary | ICD-10-CM

## 2017-02-23 DIAGNOSIS — R51 Headache: Principal | ICD-10-CM

## 2017-02-23 DIAGNOSIS — G459 Transient cerebral ischemic attack, unspecified: Secondary | ICD-10-CM

## 2017-02-23 MED ORDER — GADOBENATE DIMEGLUMINE 529 MG/ML IV SOLN
13.0000 mL | Freq: Once | INTRAVENOUS | Status: AC | PRN
  Administered 2017-02-23: 13 mL via INTRAVENOUS

## 2017-02-24 ENCOUNTER — Telehealth: Payer: Self-pay | Admitting: *Deleted

## 2017-02-24 NOTE — Telephone Encounter (Signed)
I called and spoke with Rita Zimmerman (on [place]). He is aware of normal carotid dopplers, unremarkable MRI brain and MRA head. He had no further questions.

## 2017-02-24 NOTE — Telephone Encounter (Signed)
-----   Message from Anson FretAntonia B Ahern, MD sent at 02/24/2017  8:47 AM EDT ----- Exam normal

## 2017-02-25 NOTE — Progress Notes (Signed)
Called to see patient in MRI [facility]. After receiving injection of IV MultiHance during brain MRI the patient reported diffuse body itching. She was in no distress, had no hives, and denied difficulty breathing or other complaints. She was able to finish the MRI examination without additional adverse event. She was given 50 mg IV Benadryl and observed for 20 minutes. At the time of discharge the itching had greatly diminished and she had developed no new symptoms. She was advised to seek immediate medical care for new or worsening symptoms.

## 2017-03-09 ENCOUNTER — Encounter: Payer: Self-pay | Admitting: Neurology

## 2017-03-09 ENCOUNTER — Ambulatory Visit (INDEPENDENT_AMBULATORY_CARE_PROVIDER_SITE_OTHER): Payer: Medicare Other | Admitting: Neurology

## 2017-03-09 VITALS — BP 167/83 | HR 73 | Ht <= 58 in | Wt 142.8 lb

## 2017-03-09 DIAGNOSIS — R0683 Snoring: Secondary | ICD-10-CM | POA: Diagnosis not present

## 2017-03-09 DIAGNOSIS — R519 Headache, unspecified: Secondary | ICD-10-CM

## 2017-03-09 DIAGNOSIS — R5382 Chronic fatigue, unspecified: Secondary | ICD-10-CM | POA: Diagnosis not present

## 2017-03-09 DIAGNOSIS — G459 Transient cerebral ischemic attack, unspecified: Secondary | ICD-10-CM | POA: Diagnosis not present

## 2017-03-09 DIAGNOSIS — R51 Headache: Secondary | ICD-10-CM | POA: Diagnosis not present

## 2017-03-09 MED ORDER — ASPIRIN EC 325 MG PO TBEC: 325.0000 mg | DELAYED_RELEASE_TABLET | Freq: Every day | ORAL | 0 refills | Status: AC

## 2017-03-09 NOTE — Progress Notes (Signed)
[facility]    Provider:  Dr Lucia Gaskins Referring Provider: Pearson Grippe, MD Primary Care Physician:  Pearson Grippe, MD  CC:  Headache and loss of awareness  Interval history 03/09/2017: This is a patient here for follow-up for several hours of memory loss and headaches and possible migraines, extensive evaluation for seizures, strokes, TIA, transient global amnesia, complicated migraine was performed.  MRI of the brain, MRI of the head, echocardiogram, EEG and carotid Dopplers were normal.  Here with her granddaughter who provides much information and also an interpreter because she is non-English-speaking.  She feels she has some short-term memory problems but performs all ADLs and IADls.  HPI:  Brielyn Bosak is a 71 y.o. female here as a referral from Dr. Selena Batten for headache. PMHx hypertension. Here with son and interpreter. She says she associated her headache with stress, worries and being scared. She had an episode where she lost her memory for 3-4 hours. Son says the memory loss was worrisome. The day of the memory loss, she started talking nonsense, she was having a conversation and she started talking nonsense. At 3pm the son was called by his brother, she wa asking the same question over and over, repeatedly asking the same question. She knew who the kids were, lasted for 3 hours. She was working in [employer]. She doesn't remember but she was told she was unhappy with some work a Surveyor, minerals was performing. She never regained the memory of those 3 hours. She forgot that he granddaughter had surgery but she forgot and kept asking why no one saw her. She had a headache afterwards, the rest of the day and at night.It was a normal day, no increased stress. No hx of seizures or in the family. No hx of strokes. Father, son and brother died from heart disease. Headache is in the temples and in the back of the head, throbbing. Can last 3 days. She takes alleve The headaches can last days. 10  days a month of headaches and will take one alleve on those days. The last time she had a headache was 2x in September. No light sensitivity, no nausea or vomiting, She gets blurry vision and uses eyedrops. Headaches have been ongoing and worsening. Memory loss happened Aug 7th. Has not occurred again. She did have a severe headache that day.  Review of Systems: Patient complains of symptoms per HPI as well as the following symptoms: memory loss, headache, hearing loss, eye itching, frequency of urination. Pertinent negatives and positives per HPI. All others negative.  Social History   Socioeconomic History  . Marital status: Unknown    Spouse name: Not on file  . Number of children: Not on file  . Years of education: Not on file  . Highest education level: Not on file  Social Needs  . Financial resource strain: Not on file  . Food insecurity - worry: Not on file  . Food insecurity - inability: Not on file  . Transportation needs - medical: Not on file  . Transportation needs - non-medical: Not on file  Occupational History  . Not on file  Tobacco Use  . Smoking status: Never Smoker  . Smokeless tobacco: Never Used  Substance and Sexual Activity  . Alcohol use: No  . Drug use: No  . Sexual activity: Not on file  Other Topics Concern  . Not on file  Social History Narrative   Lives at home with her son   Left handed   1  cup of caffeine daily in AM    Family History  Problem Relation Age of Onset  . Cancer Mother     Past Medical History:  Diagnosis Date  . Hypertension   . Osteoporosis     Past Surgical History:  Procedure Laterality Date  . BREAST EXCISIONAL BIOPSY Left     Current Outpatient Medications  Medication Sig Dispense Refill  . alendronate (FOSAMAX) 70 MG tablet Take 70 mg by mouth once a week. Take with a full glass of water on an empty stomach.    . hydrochlorothiazide (HYDRODIURIL) 25 MG tablet Take 25 mg by mouth daily.     No current  facility-administered medications for this visit.     Allergies as of 03/09/2017 - Review Complete 03/09/2017  Allergen Reaction Noted  . Gadolinium derivatives Itching and Nausea And Vomiting 02/23/2017  . Ibuprofen Swelling 03/09/2017    Vitals: BP (!) 167/83 (BP Location: Right Arm, Patient Position: Sitting)   Pulse 73   Ht 4' 9.87" (1.47 m)   Wt 142 lb 12.8 oz (64.8 kg)   BMI 29.98 kg/m  Last Weight:  Wt Readings from Last 1 Encounters:  03/09/17 142 lb 12.8 oz (64.8 kg)   Last Height:   Ht Readings from Last 1 Encounters:  03/09/17 4' 9.87" (1.47 m)   Physical exam: Exam: Gen: NAD, conversant, well nourised, obese, well groomed                     CV: RRR, no MRG. No Carotid Bruits. No peripheral edema, warm, nontender Eyes: Conjunctivae clear without exudates or hemorrhage  Neuro: Detailed Neurologic Exam  Speech:    Speech is normal; fluent and spontaneous with normal comprehension.  Cognition:    The patient is oriented to person, place, and time;     recent and remote memory intact;     language fluent;     normal attention, concentration,     fund of knowledge Cranial Nerves:    The pupils are equal, round, and reactive to light. The fundi are normal and spontaneous venous pulsations are present. Visual fields are full to finger confrontation. Extraocular movements are intact. Trigeminal sensation is intact and the muscles of mastication are normal. The face is symmetric. The palate elevates in the midline. Hearing intact. Voice is normal. Shoulder shrug is normal. The tongue has normal motion without fasciculations.   Coordination:    Normal finger to nose and heel to shin. Normal rapid alternating movements.   Motor Observation:    No asymmetry, no atrophy, and no involuntary movements noted. Tone:    Normal muscle tone.    Posture:    Posture is normal. normal erect    Strength:    Strength is V/V in the upper and lower limbs.         Assessment/Plan:  71 year old her with son and interpreter for several hours loss of memory and alteration of awareness. Also with chronic tension type headaches and possible migraine. Seizures vs stroke/TIA vs transient global amnesia vs complicated migraine.  Dx: TIA   - MRI of the brain, MRI of the head, echocardiogram, EEG and carotid Dopplers were unremarkable.  Echo did show grade 1 diastolic dysfunction and I have asked her to follow-up with primary care for this. - Suspect there is a component of stress -Recommend daily aspirin 325 mg for stroke prevention -She recently had a hemoglobin A1c and lipid panel checked with primary care, goal hemoglobin  A1c less than 6.5 and LDL less than 70, follow-up with primary care for treatment -Goal blood pressure is normotensive - She felt like she was getting "bigger" with advil but no visible swelling, she takes alleve without problem, no anaphylaxis or lip or tongue swelling, no SOB, she has taken aspirin before without problems. - ASA 325mg  every single day - Headache resolved - - She snores, morning headaches, memory loss, very tired during the day, TIA: Needs sleep evaluation Memory: may be secondary to sleep apnea, will send for evaluation - Follow up in 6 months, if workup negative and improved then can be discharged back to pcp    Naomie Dean, MD  [facility] [address] [place], [place] [address]  Phone [phone] Fax [phone]  A total of 25 minutes was spent face-to-face with this patient. Over half this time was spent on counseling patient on the TIA, sleep apnea, memory loss,  diagnosis and different diagnostic and therapeutic options available.

## 2017-03-09 NOTE — Patient Instructions (Addendum)
Daily Aspirin 325mg  Goal LDL < 70 Goal HgbA1c < 6.5    Apnea del sueo (Sleep Apnea) La apnea del sueo es una afeccin en la que la respiracin se detiene o se hace superficial durante el sueo. Los episodios de apnea del sueo suelen durar 10 segundos o ms, y pueden ocurrir hasta 20 veces por hora. La apnea del sueo interrumpe el sueo y evita que el cuerpo descanse como lo necesita. Esta afeccin puede aumentar el riesgo de sufrir ciertos problemas de [place], como los siguientes:  Infarto de miocardio.  Ictus.  Obesidad.  Diabetes.  Insuficiencia cardaca.  Latidos cardacos irregulares. Existen tres tipos de apnea del sueo:  Tiene apnea obstructiva del sueo. Este tipo de apnea ocurre cuando las vas respiratorias se obstruyen o colapsan.  Apnea central del sueo. Este tipo ocurre cuando la parte del cerebro que controla la respiracin no enva las seales correctas a los msculos que controlan la respiracin.  Apnea mixta del sueo. Esta es una combinacin de apnea mixta y central del sueo. CAUSAS La causa ms frecuente de esta afeccin es la obstruccin o el colapso de las vas respiratorias. Las vas respiratorias pueden colapsar o bloquearse en los siguientes casos:  Los msculos de la garganta estn anormalmente relajados.  La lengua y las amgdalas son ms grandes que lo normal.  Tiene sobrepeso.  Las vas respiratorias son ms pequeas que lo normal. FACTORES DE RIESGO Es ms probable que esta afeccin se manifieste en las personas que:  Tienen sobrepeso.  Fuman.  Tienen vas respiratorias ms pequeas que lo normal.  Son ancianos.  Son hombres.  Bebe alcohol.  Toman sedantes o tranquilizantes.  Tienen antecedentes familiares de apnea del sueo. SNTOMAS Los sntomas de esta afeccin incluyen lo siguiente:  Dificultad para quedarse dormido.  Somnolencia y Chemical engineer.  Irritabilidad.  Ronquidos fuertes.  Dolores de cabeza  matutinos.  Dificultad para concentrarse.  Olvidos.  Disminucin del inters por el sexo.  Somnolencia sin motivo aparente.  Cambios en el estado de nimo.  Cambios en la personalidad.  Sentimientos de depresin.  Levantarse con frecuencia durante la noche para orinar.  [employer].  Dolor de Advertising copywriter. DIAGNSTICO Esta afeccin se puede diagnosticar a travs de lo siguiente:  Los antecedentes mdicos.  Un examen fsico.  Neomia Dear serie de pruebas que se hacen mientras la persona duerme (estudio del sueo). Estas pruebas generalmente se hacen en un laboratorio del sueo, pero tambin pueden Architectural technologist. TRATAMIENTO El tratamiento de esta afeccin tiene como objetivo restablecer la respiracin normal y [employer] sntomas durante el sueo. Puede implicar controlar los problemas de salud que pueden afectar la respiracin, como la hipertensin arterial o la obesidad. El tratamiento puede incluir lo siguiente:  Dormir de Mudlogger.  Si tiene congestin nasal, Freight forwarder.  Evitar el consumo de depresores, como el alcohol, sedantes y opiceos.  Si tiene sobrepeso, [employer].  Realizar cambios en la dieta.  Dejar de fumar.  Usar un dispositivo para abrir las vas respiratorias mientras duerme; por ejemplo: ? Un aparato bucal. Se trata de una boquilla hecha a medida que desplaza la mandbula hacia adelante. ? Un dispositivo de presin positiva y continua de las vas respiratorias (PPC). Este dispositivo suministra oxgeno a las vas respiratorias a travs de Tourist information centre manager. ? Un dispositivo de presin espiratoria nasal (EPAP) de las vas respiratorias Este dispositivo tiene vlvulas que se colocan en cada fosa nasal. ? Un dispositivo de presin [employer]  niveles (BPAP) de las vas respiratorias. Este dispositivo suministra oxgeno a las vas respiratorias a travs de Tourist information centre manageruna mscara.  Someterse a Biomedical engineerciruga si los dems tratamientos no Comptrollerresultan eficaces.  Durante la ciruga, el exceso de tejido se elimina para aumentar el ancho de las vas respiratorias. Realizar un tratamiento para la apnea del sueo es importante. Sin el tratamiento Clevelandadecuado, esta afeccin puede causar lo siguiente:  Hipertensin arterial.  Enfermedad arterial coronaria.  Incapacidad (del hombre) para alcanzar o Designer, fashion/clothingmantener una ereccin (impotencia).  Reduccin de las habilidades de pensamiento. INSTRUCCIONES PARA EL CUIDADO EN EL HOGAR  Haga cambios en su estilo de vida segn las recomendaciones de su mdico.  Consuma una dieta sana y [place] and Barbudabien equilibrada.  Tome los medicamentos de venta libre y los recetados solamente como se lo haya indicado el mdico.  Evite el uso de depresores, como el alcohol, sedantes y narcticos.  Si tiene sobrepeso, tome medidas para bajar de [place]peso.  Si le proporcionaron un dispositivo para abrir las vas respiratorias mientras duerme, selo solamente como se lo haya indicado el mdico.  No consuma ningn producto que contenga tabaco, lo que incluye cigarrillos, tabaco de Theatre managermascar y Administrator, Civil Servicecigarrillos electrnicos. Si necesita ayuda para dejar de fumar, consulte al mdico.  Concurra a todas las visitas de control como se lo haya indicado el mdico. Esto es importante. SOLICITE ATENCIN MDICA SI:  El dispositivo que recibi para abrir las vas respiratorias durante el sueo es incmodo o no parece funcionar.  Los sntomas no mejoran.  Los sntomas empeoran. SOLICITE ATENCIN MDICA DE INMEDIATO SI:  Siente dolor en el pecho.  Le falta el aire.  Desarrolla Dentistmalestar en la espalda, los brazos o el Butteestmago.  Tiene dificultad para hablar.  Siente debilidad o adormecimiento en un lado del cuerpo.  Tiene parlisis facial. Estos sntomas pueden representar un problema grave que constituye Radio broadcast assistantuna emergencia. No espere hasta que los sntomas desaparezcan. Solicite atencin mdica de inmediato. Comunquese con el servicio de emergencias de su localidad  (911 en los [place]). No conduzca por sus propios medios OfficeMax Incorporatedhasta el hospital. Esta informacin no tiene Theme park managercomo fin reemplazar el consejo del mdico. Asegrese de hacerle al mdico cualquier pregunta que tenga. Document Released: 01/28/2005 Document Revised: 08/12/2015 Document Reviewed: 01/28/2015 Elsevier Interactive Patient Education  [employer]2018 Elsevier Inc.

## 2017-03-17 ENCOUNTER — Telehealth: Payer: Self-pay | Admitting: Nurse Practitioner

## 2017-03-17 ENCOUNTER — Ambulatory Visit: Payer: Medicare Other | Attending: Nurse Practitioner | Admitting: Nurse Practitioner

## 2017-03-17 ENCOUNTER — Encounter: Payer: Self-pay | Admitting: Nurse Practitioner

## 2017-03-17 VITALS — BP 148/76 | HR 69 | Temp 98.6°F | Resp 18 | Ht <= 58 in | Wt 141.8 lb

## 2017-03-17 DIAGNOSIS — M810 Age-related osteoporosis without current pathological fracture: Secondary | ICD-10-CM | POA: Insufficient documentation

## 2017-03-17 DIAGNOSIS — Z888 Allergy status to other drugs, medicaments and biological substances status: Secondary | ICD-10-CM | POA: Insufficient documentation

## 2017-03-17 DIAGNOSIS — Z809 Family history of malignant neoplasm, unspecified: Secondary | ICD-10-CM | POA: Diagnosis not present

## 2017-03-17 DIAGNOSIS — R350 Frequency of micturition: Secondary | ICD-10-CM | POA: Diagnosis not present

## 2017-03-17 DIAGNOSIS — Z9889 Other specified postprocedural states: Secondary | ICD-10-CM | POA: Insufficient documentation

## 2017-03-17 DIAGNOSIS — M818 Other osteoporosis without current pathological fracture: Secondary | ICD-10-CM

## 2017-03-17 DIAGNOSIS — Z79899 Other long term (current) drug therapy: Secondary | ICD-10-CM | POA: Insufficient documentation

## 2017-03-17 DIAGNOSIS — I10 Essential (primary) hypertension: Secondary | ICD-10-CM

## 2017-03-17 DIAGNOSIS — N390 Urinary tract infection, site not specified: Secondary | ICD-10-CM | POA: Insufficient documentation

## 2017-03-17 DIAGNOSIS — Z886 Allergy status to analgesic agent status: Secondary | ICD-10-CM | POA: Diagnosis not present

## 2017-03-17 DIAGNOSIS — Z7982 Long term (current) use of aspirin: Secondary | ICD-10-CM | POA: Insufficient documentation

## 2017-03-17 DIAGNOSIS — Z7983 Long term (current) use of bisphosphonates: Secondary | ICD-10-CM | POA: Diagnosis not present

## 2017-03-17 LAB — POCT URINALYSIS DIPSTICK
Bilirubin, UA: NEGATIVE
Glucose, UA: NEGATIVE
Ketones, UA: NEGATIVE
NITRITE UA: NEGATIVE
PROTEIN UA: NEGATIVE
SPEC GRAV UA: 1.015 (ref 1.010–1.025)
UROBILINOGEN UA: 0.2 U/dL
pH, UA: 6.5 (ref 5.0–8.0)

## 2017-03-17 MED ORDER — ALENDRONATE SODIUM 70 MG PO TABS: 70.0000 mg | ORAL_TABLET | ORAL | 4 refills | Status: AC

## 2017-03-17 MED ORDER — HYDROCHLOROTHIAZIDE 25 MG PO TABS: 25.0000 mg | ORAL_TABLET | Freq: Every day | ORAL | 1 refills | Status: DC

## 2017-03-17 NOTE — Patient Instructions (Addendum)
Hipertensin Hypertension La hipertensin, conocida comnmente como presin arterial alta, se produce cuando la sangre bombea en las arterias con mucha fuerza. Las arterias son los vasos sanguneos que transportan la sangre desde el corazn al resto del cuerpo. La hipertensin hace que el corazn haga ms esfuerzo para bombear sangre y puede provocar que las arterias se estrechen o endurezcan. La hipertensin no tratada o no controlada puede causar infarto de miocardio, accidentes cerebrovasculares, enfermedad renal y otros problemas. Una lectura de la presin arterial consiste de un nmero ms alto sobre un nmero ms bajo. En condiciones ideales, la presin arterial debe estar por debajo de 120/80. El primer nmero ("superior") es la presin sistlica. Es la medida de la presin de las arterias cuando el corazn late. El segundo nmero ("inferior") es la presin diastlica. Es la medida de la presin en las arterias cuando el corazn se relaja. Cules son las causas? Se desconoce la causa de esta afeccin. Qu incrementa el riesgo? Algunos factores de riesgo de hipertensin estn bajo su control. Otros no. Factores que puede modificar  Fumar.  Tener diabetes mellitus tipo 2, colesterol alto, o ambos.  No hacer la cantidad suficiente de actividad fsica o ejercicio.  Tener sobrepeso.  Consumir mucha grasa, azcar, caloras o sal (sodio) en su dieta.  Beber alcohol en exceso. Factores que son difciles o imposibles de modificar  Tener enfermedad renal crnica.  Tener antecedentes familiares de presin arterial alta.  La edad. Los riesgos aumentan con la edad.  La raza. El riesgo es mayor para las personas afroamericanas.  El sexo. Antes de los 45aos, los hombres corren ms riesgo que las mujeres. Despus de los 65aos, las mujeres corren ms riesgo que los hombres.  Tener apnea obstructiva del sueo.  El estrs. Cules son los signos o los sntomas? La presin arterial  extremadamente alta (crisis hipertensiva) puede provocar:  Dolor de cabeza.  Ansiedad.  Falta de aire.  Hemorragia nasal.  Nuseas y vmitos.  Dolor de pecho intenso.  Una crisis de movimientos que no puede controlar (convulsiones).  Cmo se diagnostica? Esta afeccin se diagnostica midiendo su presin arterial mientras se encuentra sentado, con el brazo apoyado sobre una superficie. El brazalete del tensimetro debe colocarse directamente sobre la piel de la parte superior del brazo y al nivel de su corazn. Debe medirla al menos dos veces en el mismo brazo. Determinadas condiciones pueden causar una diferencia de presin arterial entre el brazo izquierdo y el derecho. Ciertos factores pueden provocar que las lecturas de la presin arterial sean inferiores o superiores a lo normal (elevadas) por un perodo corto de tiempo:  Si su presin arterial es ms alta cuando se encuentra en el consultorio del mdico que cuando la mide en su hogar, se denomina "hipertensin de bata blanca". La mayora de las personas que tienen esta afeccin no deben ser medicadas.  Si su presin arterial es ms alta en el hogar que cuando se encuentra en el consultorio del mdico, se denomina "hipertensin enmascarada". La mayora de las personas que tienen esta afeccin deben ser medicadas para controlar la presin arterial.  Si tiene una lecturas de presin arterial alta durante una visita o si tiene presin arterial normal con otros factores de riesgo:  Es posible que se le pida que regrese otro da para volver a controlar su presin arterial.  Se le puede pedir que se controle la presin arterial en su casa durante 1 semana o ms.  Si se le diagnostica hipertensin, es posible que   se le realicen otros anlisis de sangre o estudios de diagnstico por imgenes para ayudar a su mdico a comprender su riesgo general de tener otras afecciones. Cmo se trata? Esta afeccin se trata haciendo cambios saludables  en el estilo de vida, tales como ingerir alimentos saludables, realizar ms ejercicio y reducir el consumo de alcohol. El mdico puede recetarle medicamentos si los cambios en el estilo de vida no son suficientes para lograr controlar la presin arterial y si:  Su presin arterial sistlica est por encima de 130.  Su presin arterial diastlica est por encima de 80.  La presin arterial deseada puede variar en funcin de las enfermedades, la edad y otros factores personales. Siga estas instrucciones en su casa: Comida y bebida  Siga una dieta con alto contenido de fibras y potasio, y con bajo contenido de sodio, azcar agregada y grasas. Un ejemplo de plan alimenticio es la dieta DASH (Dietary Approaches to Stop Hypertension, Mtodos alimenticios para detener la hipertensin). Para alimentarse de esta manera: ? Coma mucha fruta y verdura fresca. Trate de que la mitad del plato de cada comida sea de frutas y verduras. ? Coma cereales integrales, como pasta integral, arroz integral y pan integral. Llene aproximadamente un cuarto del plato con cereales integrales. ? Coma y beba productos lcteos con bajo contenido de grasa, como leche descremada o yogur bajo en grasas. ? Evite la ingesta de cortes de carne grasa, carne procesada o curada, y carne de ave con piel. Llene aproximadamente un cuarto del plato con protenas magras, como pescado, pollo sin piel, frijoles, huevos y tofu. ? Evite ingerir alimentos prehechos o procesados. En general, estos tienen mayor cantidad de sodio, azcar agregada y grasa.  Reduzca su ingesta diaria de sodio. La mayora de las personas que tienen hipertensin deben comer menos de 1500 mg de sodio por da.  Limite el consumo de alcohol a no ms de 1 medida por da si es mujer y no est embarazada y a 2 medidas por da si es hombre. Una medida equivale a 12onzas de cerveza, 5onzas de vino o 1onzas de bebidas alcohlicas de alta graduacin. Estilo de vida  Trabaje  con su mdico para mantener un peso saludable o perder peso. Pregntele cual es su peso recomendado.  Realice al menos 30 minutos de ejercicio que haga que se acelere su corazn (ejercicio aerbico) la mayora de los das de la semana. Estas actividades pueden incluir caminar, nadar o andar en bicicleta.  Incluya ejercicios para fortalecer sus msculos (ejercicios de resistencia), como pilates o levantamiento de pesas, como parte de su rutina semanal de ejercicios. Intente realizar 30minutos de este tipo de ejercicios al menos tres das a la semana.  No consuma ningn producto que contenga nicotina o tabaco, como cigarrillos y cigarrillos electrnicos. Si necesita ayuda para dejar de fumar, consulte al mdico.  Contrlese la presin arterial en su casa segn las indicaciones del mdico.  Concurra a todas las visitas de control como se lo haya indicado el mdico. Esto es importante. Medicamentos  Tome los medicamentos de venta libre y los recetados solamente como se lo haya indicado el mdico. Siga cuidadosamente las indicaciones. Los medicamentos para la presin arterial deben tomarse segn las indicaciones.  No omita las dosis de medicamentos para la presin arterial. Si lo hace, estar en riesgo de tener problemas y puede hacer que los medicamentos sean menos eficaces.  Pregntele a su mdico a qu efectos secundarios o reacciones a los medicamentos debe prestar atencin. Comunquese con   un mdico si:  Piensa que tiene una reaccin a un medicamento que est tomando.  Tiene dolores de cabeza frecuentes (recurrentes).  Siente mareos.  Tiene hinchazn en los tobillos.  Tiene problemas de visin. Solicite ayuda de inmediato si:  Siente un dolor de cabeza intenso o confusin.  Siente debilidad inusual o adormecimiento.  Siente que va a desmayarse.  Siente un dolor intenso en el pecho o el abdomen.  Vomita repetidas veces.  Tiene dificultad para respirar. Resumen  La  hipertensin se produce cuando la sangre bombea en las arterias con mucha fuerza. Si esta afeccin no se controla, podra correr riesgo de tener complicaciones graves.  La presin arterial deseada puede variar en funcin de las enfermedades, la edad y otros factores personales. Para la Franklin Resourcesmayora de las personas, una presin arterial normal es menor que 120/80.  La hipertensin se trata con cambios en el estilo de vida, medicamentos o una combinacin de Alcesterambos. Los Danaher Corporationcambios en el estilo de vida incluyen prdida de peso, ingerir alimentos sanos, seguir una dieta baja en sodio, hacer ms ejercicio y Glass blower/designerlimitar el consumo de alcohol. Esta informacin no tiene Theme park managercomo fin reemplazar el consejo del mdico. Asegrese de hacerle al mdico cualquier pregunta que tenga. Document Released: 04/20/2005 Document Revised: 04/01/2016 Document Reviewed: 04/01/2016 Elsevier Interactive Patient Education  2018 [employer] Plan de alimentacin DASH (DASH Eating Plan) DASH es la sigla en ingls de "Enfoques Alimentarios para Detener la Hipertensin". El plan de alimentacin DASH ha demostrado bajar la presin arterial elevada (hipertensin). Los beneficios adicionales para la salud pueden incluir la disminucin del riesgo de diabetes mellitus tipo2, enfermedades cardacas e ictus. Este plan tambin puede ayudar a Geophysical data processoradelgazar. QU DEBO SABER ACERCA DEL PLAN DE ALIMENTACIN DASH? Para el plan de alimentacin DASH, seguir las siguientes pautas generales:  Elija los alimentos que contienen menos de 150 miligramos de sodio por porcin (segn se indica en la etiqueta de los alimentos).  Use hierbas o aderezos sin sal, en lugar de sal de mesa o sal marina.  Consulte al mdico o farmacutico antes de usar sustitutos de la sal.  Consuma los productos con menor contenido de sodio. Estos productos suelen estar etiquetados como "bajo en sodio" o "sin agregado de sal".  Coma alimentos frescos. No consuma una gran cantidad de  alimentos enlatados.  Coma ms verduras, frutas y productos lcteos con bajo contenido de Alphagrasas.  Elija los cereales integrales. Busque la palabra "integral" en Estate agentel primer lugar de la lista de ingredientes.  Elija el pescado y el pollo o el pavo sin piel ms a menudo que las carnes rojas. Limite el consumo de pescado, carne de ave y carne a 6onzas (170g) por Futures traderda.  Limite el consumo de dulces, postres, azcares y bebidas azucaradas.  Elija las grasas saludables para el corazn.  Consuma ms comida casera y menos de restaurante, de buf y comida rpida.  Limite el consumo de alimentos fritos.  No fra los alimentos. A la hora de cocinarlos, opte por hornearlos, hervirlos, grillarlos y asarlos a Patent attorneyla parrilla.  Cuando coma en un restaurante, pida que preparen su comida con menos sal o, en lo posible, sin nada de sal. QU ALIMENTOS PUEDO COMER? Pida ayuda a un nutricionista para conocer las necesidades calricas individuales. Cereales Pan de salvado o integral. Arroz integral. Pastas de salvado o integrales. Quinua, trigo burgol y cereales integrales. Cereales con bajo contenido de sodio. Tortillas de harina de maz o de salvado. Pan de maz integral. Galletas saladas integrales. Galletas con  bajo contenido de Circlevillesodio. Vegetales Verduras frescas o congeladas (crudas, al vapor, asadas o grilladas). Jugos de tomate y verduras con contenido bajo o reducido de sodio. Pasta y salsa de tomate con contenido bajo o reducido de sodio. Verduras enlatadas con bajo contenido de sodio o reducido de sodio. Nils PyleFrutas Nils PyleFrutas frescas, en conserva (en su jugo natural) o frutas congeladas. Carnes y otros productos con protenas Carne de res molida (al 85% o ms San Marinomagra), carne de res de animales alimentados con pastos o carne de res sin la grasa. Pollo o pavo sin piel. Carne de pollo o de Heppnerpavo molida. Cerdo sin la grasa. Todos los pescados y frutos de mar. Huevos. Porotos, guisantes o lentejas secos. Frutos secos y  semillas sin sal. Frijoles enlatados sin sal. Lcteos Productos lcteos con bajo contenido de grasas, como Hamptonleche descremada o al 1%, quesos reducidos en grasas o al 2%, ricota con bajo contenido de grasas o Leggett & Plattqueso cottage, o yogur natural con bajo contenido de Elmer Citygrasas. Quesos con contenido bajo o reducido de sodio. Grasas y Writeraceites Margarinas en barra que no contengan grasas trans. Mayonesa y alios para ensaladas livianos o reducidos en grasas (reducidos en sodio). Aguac[place]. Aceites de crtamo, oliva o canola. Mantequilla natural de man o almendra. Otros Palomitas de maz y pretzels sin sal. Los artculos mencionados arriba pueden no ser Raytheonuna lista completa de las bebidas o los alimentos recomendados. Comunquese con el nutricionista para conocer ms opciones. QU ALIMENTOS NO SE RECOMIENDAN? Cereales Pan blanco. Pastas blancas. Arroz blanco. Pan de maz refinado. Bagels y croissants. Galletas saladas que contengan grasas trans. Vegetales Vegetales con crema o fritos. Verduras en salsa de North Portqueso. Verduras enlatadas comunes. Pasta y salsa de tomate en lata comunes. Jugos comunes de tomate y de verduras. Nils PyleFrutas Fruta enlatada en almbar liviano o espeso. Jugo de frutas. Carnes y otros productos con protenas Cortes de carne con Holiday representativegrasa. Costillas, alas de pollo, tocineta, salchicha, mortadela, salame, chinchulines, tocino, perros calientes, salchichas alemanas y embutidos envasados. Frutos secos y semillas con sal. Frijoles con sal en lata. Lcteos Leche entera o al 2%, crema, mezcla de Endeavorleche y crema, y queso crema. Yogur entero o endulzado. Quesos o queso azul con alto contenido de Neurosurgeongrasas. Cremas no lcteas y coberturas batidas. Quesos procesados, quesos para untar o cuajadas. Condimentos Sal de cebolla y ajo, sal condimentada, sal de mesa y sal marina. Salsas en lata y envasadas. Salsa [place]. Salsa trtara. Salsa barbacoa. Salsa teriyaki. Salsa de soja, incluso la que tiene contenido  reducido de Rye Brooksodio. Salsa de carne. Salsa de pescado. Salsa de [place] de Roblesostras. Salsa rosada. Rbano picante. Ketchup y mostaza. Saborizantes y tiernizantes para carne. Caldo en cubitos. Salsa picante. Salsa tabasco. Adobos. Aderezos para tacos. Salsas. Grasas y 24[address] West Mainaceites Mantequilla, Indiamargarina en barra, Blawenburgmanteca de Fergusoncerdo, Garrisongrasa, Singaporemantequilla clarificada y Steffanie Rainwatergrasa de tocino. Aceites de coco, de palmiste o de palma. Aderezos comunes para ensalada. Otros Pickles y Blytheaceitunas. Palomitas de maz y pretzels con sal. Los artculos mencionados arriba pueden no ser Raytheonuna lista completa de las bebidas y los alimentos que se Theatre stage managerdeben evitar. Comunquese con el nutricionista para obtener ms informacin. DNDE Raelyn MoraPUEDO ENCONTRAR MS INFORMACIN? Instituto Nacional del New Sarpyorazn, del Pulmn y de Risk managerla Sangre (National Heart, Lung, and Blood Institute): CablePromo.itwww.nhlbi.nih.gov/health/health-topics/topics/dash/ Esta informacin no tiene Theme park managercomo fin reemplazar el consejo del mdico. Asegrese de hacerle al mdico cualquier pregunta que tenga. Document Released: 04/09/2011 Document Revised: 08/12/2015 Document Reviewed: 02/22/2013 Elsevier Interactive Patient Education  2017 [employer]vier Inc.

## 2017-03-17 NOTE — Telephone Encounter (Signed)
Please call patient and let her know that her fosamax has been filled. Will need to repeat Bone Density testing next year.

## 2017-03-17 NOTE — Progress Notes (Signed)
Assessment & Plan:  Rita Zimmerman was seen today for new patient (initial visit) and hypertension.  Diagnoses and all orders for this visit:  Essential hypertension -     CBC -     Basic metabolic panel -     Lipid panel -     hydrochlorothiazide (HYDRODIURIL) 25 MG tablet; Take 1 tablet (25 mg total) daily by mouth.  Urinary frequency -     Urinalysis Dipstick  Other osteoporosis without current pathological fracture -     DG Bone Density; Future  Other orders -     alendronate (FOSAMAX) 70 MG tablet; Take 1 tablet (70 mg total) once a week by mouth. Take with a full glass of water on an empty stomach.   Patient has been counseled on age-appropriate routine health concerns for screening and prevention. These are reviewed and up-to-date. Referrals have been placed accordingly. Immunizations are up-to-date or declined.     Subjective:   Chief Complaint  Patient presents with  . New Patient (Initial Visit)  . Hypertension   HPI Rita Zimmerman 71 y.o. female presents to office today to establish care. She has a history of hypertension, osteoporosis and currently urinary frequency. She is accompanied by her granddaughter today who is interpreting.   Essential Hypertension She takes lisinopril 25mg  daily for her blood pressure. She checks her blood pressure at home with averages upper  140/60-70s. She has been on blood pressure medication for almost one year. Although her blood pressure is slightly above guidelines today, due to her age I have asked her to bring in a BP log to her next follow up visit. Will adjust medications based on home readings at that time if needed.  BP Readings from Last 3 Encounters:  03/17/17 (!) 148/76  03/09/17 (!) 167/83  02/04/17 (!) 173/75  She denies chest pain, shortness of breath, palpitations, syncope or BLE edema.   Urinary Tract Infection Patient complains of frequency and nocturia She has had symptoms for a few months.  Patient denies back pain,  fever, stomach ache and vaginal discharge. Patient does not have a history of recurrent UTI.  Patient does not have a history of pyelonephritis.    History of Osteoporosis She reportedly was diagnosed with Osteoporosis in [place] last year via BDT and was started on Fosamax. She travels back and forth between [place] and [place] several times a year. Will repeat BDT next year. May continue on fosamax at this time. Will check vitamin d and calcium at next office visit.       Past Medical History:  Diagnosis Date  . Hypertension   . Osteoporosis     Past Surgical History:  Procedure Laterality Date  . BREAST EXCISIONAL BIOPSY Left     Family History  Problem Relation Age of Onset  . Cancer Mother     Social History   Socioeconomic History  . Marital status: Unknown    Spouse name: Not on file  . Number of children: Not on file  . Years of education: Not on file  . Highest education level: Not on file  Social Needs  . Financial resource strain: Not on file  . Food insecurity - worry: Not on file  . Food insecurity - inability: Not on file  . Transportation needs - medical: Not on file  . Transportation needs - non-medical: Not on file  Occupational History  . Not on file  Tobacco Use  . Smoking status: Never Smoker  . Smokeless tobacco: Never  Used  Substance and Sexual Activity  . Alcohol use: No  . Drug use: No  . Sexual activity: Not on file  Other Topics Concern  . Not on file  Social History Narrative   Lives at home with her son   Left handed   1 cup of caffeine daily in AM    Outpatient Medications Prior to Visit  Medication Sig Dispense Refill  . aspirin EC 325 MG tablet Take 1 tablet (325 mg total) daily by mouth. 30 tablet 0  . alendronate (FOSAMAX) 70 MG tablet Take 70 mg by mouth once a week. Take with a full glass of water on an empty stomach.    . hydrochlorothiazide (HYDRODIURIL) 25 MG tablet Take 25 mg by mouth daily.     No facility-administered  medications prior to visit.     Allergies  Allergen Reactions  . Gadolinium Derivatives Itching and Nausea And Vomiting  . Ibuprofen Swelling    Review of Systems  Constitutional: Negative for fever, malaise/fatigue and weight loss.  HENT: Negative.  Negative for nosebleeds.   Eyes: Negative.  Negative for blurred vision, double vision and photophobia.  Respiratory: Negative.  Negative for cough and shortness of breath.   Cardiovascular: Negative.  Negative for chest pain, palpitations and leg swelling.  Gastrointestinal: Negative.  Negative for abdominal pain, constipation, diarrhea, heartburn, nausea and vomiting.  Genitourinary: Positive for frequency. Negative for dysuria, flank pain, hematuria and urgency.  Musculoskeletal: Negative.  Negative for myalgias.  Neurological: Negative.  Negative for dizziness, focal weakness, seizures and headaches.  Endo/Heme/Allergies: Negative for environmental allergies.  Psychiatric/Behavioral: Negative.  Negative for suicidal ideas.       Objective:    Physical Exam  Constitutional: She is oriented to person, place, and time. She appears well-developed and well-nourished. She is cooperative.  HENT:  Head: Normocephalic and atraumatic.  Eyes: EOM are normal.  Neck: Normal range of motion.  Cardiovascular: Normal rate, regular rhythm, normal heart sounds and intact distal pulses. Exam reveals no gallop and no friction rub.  No murmur heard. Pulmonary/Chest: Effort normal and breath sounds normal. No tachypnea. No respiratory distress. She has no decreased breath sounds. She has no wheezes. She has no rhonchi. She has no rales. She exhibits no tenderness.  Abdominal: Soft. Bowel sounds are normal. She exhibits no distension and no mass. There is no tenderness. There is no rebound and no guarding.  Musculoskeletal: Normal range of motion. She exhibits no edema.  Neurological: She is alert and oriented to person, place, and time. Coordination  normal.  Skin: Skin is warm and dry.  Psychiatric: She has a normal mood and affect. Her behavior is normal. Judgment and thought content normal.  Nursing note and vitals reviewed.   BP (!) 148/76 (BP Location: Left Arm, Patient Position: Sitting, Cuff Size: Normal)   Pulse 69   Temp 98.6 F (37 C) (Oral)   Resp 18   Ht 4\' 9"  (1.448 m)   Wt 141 lb 12.8 oz (64.3 kg)   SpO2 97%   BMI 30.69 kg/m  Wt Readings from Last 3 Encounters:  03/17/17 141 lb 12.8 oz (64.3 kg)  03/09/17 142 lb 12.8 oz (64.8 kg)  02/04/17 142 lb 12.8 oz (64.8 kg)        Patient has been counseled extensively about nutrition and exercise as well as the importance of adherence with medications and regular follow-up. The patient was given clear instructions to go to ER or return to medical center if  symptoms don't improve, worsen or new problems develop. The patient verbalized understanding.   Follow-up: Return in about 4 months (around 07/15/2017) for BP recheck.  Patient will be traveling to [place] for the winter next week.   Claiborne RiggZelda W Nimai Burbach, FNP-BC [facility] [place], [place] [phone]   03/17/2017, 5:27 PM

## 2017-03-18 ENCOUNTER — Telehealth: Payer: Self-pay

## 2017-03-18 LAB — LIPID PANEL
CHOL/HDL RATIO: 4.5 ratio — AB (ref 0.0–4.4)
CHOLESTEROL TOTAL: 249 mg/dL — AB (ref 100–199)
HDL: 55 mg/dL (ref >39)
LDL CALC: 168 mg/dL — AB (ref 0–99)
Triglycerides: 128 mg/dL (ref 0–149)
VLDL CHOLESTEROL CAL: 26 mg/dL (ref 5–40)

## 2017-03-18 LAB — CBC
HEMATOCRIT: 45.3 % (ref 34.0–46.6)
HEMOGLOBIN: 15.3 g/dL (ref 11.1–15.9)
MCH: 29.5 pg (ref 26.6–33.0)
MCHC: 33.8 g/dL (ref 31.5–35.7)
MCV: 88 fL (ref 79–97)
Platelets: 394 10*3/uL — ABNORMAL HIGH (ref 150–379)
RBC: 5.18 x10E6/uL (ref 3.77–5.28)
RDW: 13.9 % (ref 12.3–15.4)
WBC: 9.6 10*3/uL (ref 3.4–10.8)

## 2017-03-18 LAB — BASIC METABOLIC PANEL
BUN / CREAT RATIO: 20 (ref 12–28)
BUN: 17 mg/dL (ref 8–27)
CO2: 23 mmol/L (ref 20–29)
CREATININE: 0.84 mg/dL (ref 0.57–1.00)
Calcium: 10.4 mg/dL — ABNORMAL HIGH (ref 8.7–10.3)
Chloride: 101 mmol/L (ref 96–106)
GFR, EST AFRICAN AMERICAN: 81 mL/min/{1.73_m2} (ref >59)
GFR, EST NON AFRICAN AMERICAN: 71 mL/min/{1.73_m2} (ref >59)
Glucose: 92 mg/dL (ref 65–99)
Potassium: 4 mmol/L (ref 3.5–5.2)
SODIUM: 141 mmol/L (ref 134–144)

## 2017-03-18 NOTE — Telephone Encounter (Signed)
-----   Message from Zelda W Fleming, NP sent at 03/17/2017  5:44 PM EST ----- Please have patient return to lab visit for urinalysis in a few weeks to retest her urine due to abnormal lab results. 

## 2017-03-18 NOTE — Telephone Encounter (Deleted)
Interpreter ID# [id number] Name :Rita Zimmerman

## 2017-03-18 NOTE — Telephone Encounter (Signed)
-----   Message from Claiborne RiggZelda W Fleming, NP sent at 03/18/2017 11:01 AM EST ----- Labs are essentially normal however lipid panel is abnormal.  Please ask if patient was fasting prior to labs being drawn. Was she nothing to eat or drink by mouth after 12 midnight on the day of her office visit? If not we will recheck fasting labs at her next office visit. Please reply whether labs were fasting or non fasting after speaking with patient.

## 2017-03-18 NOTE — Telephone Encounter (Signed)
-----   Message from Claiborne RiggZelda W Fleming, NP sent at 03/17/2017  5:44 PM EST ----- Please have patient return to lab visit for urinalysis in a few weeks to retest her urine due to abnormal lab results.

## 2017-03-18 NOTE — Telephone Encounter (Signed)
Interpreter ID # [id number] Rita Zimmerman   Patient did not pick up. Interpreter left a voicemail for patient to callback.

## 2017-03-18 NOTE — Telephone Encounter (Signed)
Interpreter Onalee HuaDavid [phone]#[id number]  Patient son answered and is on the Designated party release form. Patient is aware medication Fosamax is filled and aware that patient need to make an appointment to retest urine in few weeks.

## 2017-03-18 NOTE — Telephone Encounter (Signed)
Patient's son called stating that his mother did not ate anything before she got her lab done from his knowledge.

## 2017-03-19 ENCOUNTER — Other Ambulatory Visit: Payer: Self-pay | Admitting: Nurse Practitioner

## 2017-03-19 ENCOUNTER — Ambulatory Visit: Payer: Medicare Other | Attending: Nurse Practitioner

## 2017-03-19 DIAGNOSIS — M810 Age-related osteoporosis without current pathological fracture: Secondary | ICD-10-CM

## 2017-03-19 DIAGNOSIS — R8290 Unspecified abnormal findings in urine: Secondary | ICD-10-CM | POA: Diagnosis not present

## 2017-03-19 MED ORDER — ATORVASTATIN CALCIUM 20 MG PO TABS: 20.0000 mg | ORAL_TABLET | Freq: Every day | ORAL | 3 refills | Status: AC

## 2017-03-19 NOTE — Progress Notes (Signed)
Patient here for lab visit only 

## 2017-03-19 NOTE — Telephone Encounter (Signed)
Noted  

## 2017-03-19 NOTE — Addendum Note (Signed)
Addended by: Paschal DoppWHITE, VANESSA J on: 03/19/2017 04:41 PM   Modules accepted: Orders

## 2017-03-20 LAB — URINALYSIS, COMPLETE
BILIRUBIN UA: NEGATIVE
GLUCOSE, UA: NEGATIVE
NITRITE UA: NEGATIVE
Protein, UA: NEGATIVE
SPEC GRAV UA: 1.025 (ref 1.005–1.030)
UUROB: 0.2 mg/dL (ref 0.2–1.0)
pH, UA: 5.5 (ref 5.0–7.5)

## 2017-03-20 LAB — VITAMIN D 25 HYDROXY (VIT D DEFICIENCY, FRACTURES): VIT D 25 HYDROXY: 27 ng/mL — AB (ref 30.0–100.0)

## 2017-03-20 LAB — MICROSCOPIC EXAMINATION
BACTERIA UA: NONE SEEN
CASTS: NONE SEEN /LPF

## 2017-03-21 LAB — URINE CULTURE: Organism ID, Bacteria: NO GROWTH

## 2017-04-20 ENCOUNTER — Institutional Professional Consult (permissible substitution): Payer: Medicare Other | Admitting: Neurology

## 2017-09-06 ENCOUNTER — Ambulatory Visit: Payer: Medicare Other | Admitting: Neurology

## 2017-09-06 ENCOUNTER — Ambulatory Visit: Payer: Medicare Other | Admitting: Adult Health

## 2017-10-06 ENCOUNTER — Other Ambulatory Visit: Payer: Self-pay

## 2017-10-06 DIAGNOSIS — I10 Essential (primary) hypertension: Secondary | ICD-10-CM

## 2017-10-06 MED ORDER — HYDROCHLOROTHIAZIDE 25 MG PO TABS: 25.0000 mg | ORAL_TABLET | Freq: Every day | ORAL | 0 refills | Status: DC

## 2017-10-06 NOTE — Progress Notes (Signed)
Patient came in today request to Hydrochlorothiazide. Rx refilled.  Pt. Made an upcoming appt with PCP. .Marland Kitchen

## 2017-11-03 ENCOUNTER — Ambulatory Visit: Payer: Medicare Other | Attending: Nurse Practitioner | Admitting: Nurse Practitioner

## 2017-11-03 ENCOUNTER — Encounter: Payer: Self-pay | Admitting: Nurse Practitioner

## 2017-11-03 VITALS — BP 150/83 | HR 76 | Temp 98.4°F | Ht <= 58 in | Wt 138.8 lb

## 2017-11-03 DIAGNOSIS — Z79899 Other long term (current) drug therapy: Secondary | ICD-10-CM | POA: Diagnosis not present

## 2017-11-03 DIAGNOSIS — Z91041 Radiographic dye allergy status: Secondary | ICD-10-CM | POA: Insufficient documentation

## 2017-11-03 DIAGNOSIS — Z1211 Encounter for screening for malignant neoplasm of colon: Secondary | ICD-10-CM | POA: Diagnosis not present

## 2017-11-03 DIAGNOSIS — I10 Essential (primary) hypertension: Secondary | ICD-10-CM | POA: Insufficient documentation

## 2017-11-03 DIAGNOSIS — Z7982 Long term (current) use of aspirin: Secondary | ICD-10-CM | POA: Diagnosis not present

## 2017-11-03 DIAGNOSIS — E782 Mixed hyperlipidemia: Secondary | ICD-10-CM | POA: Insufficient documentation

## 2017-11-03 DIAGNOSIS — Z886 Allergy status to analgesic agent status: Secondary | ICD-10-CM | POA: Diagnosis not present

## 2017-11-03 MED ORDER — LISINOPRIL-HYDROCHLOROTHIAZIDE 20-25 MG PO TABS: 1.0000 | ORAL_TABLET | Freq: Every day | ORAL | 0 refills | Status: DC

## 2017-11-03 MED ORDER — LISINOPRIL-HYDROCHLOROTHIAZIDE 20-25 MG PO TABS: 1.0000 | ORAL_TABLET | Freq: Every day | ORAL | 2 refills | Status: DC

## 2017-11-03 NOTE — Patient Instructions (Signed)
Plan de alimentación DASH  DASH Eating Plan  DASH es la sigla en inglés de "Enfoques Alimentarios para Detener la Hipertensión" (Dietary Approaches to Stop Hypertension). El plan de alimentación DASH ha demostrado bajar la presión arterial elevada (hipertensión). También puede reducir el riesgo de diabetes tipo 2, enfermedad cardíaca y accidente cerebrovascular. Este plan también puede ayudar a adelgazar.  Consejos para seguir este plan  Pautas generales  · Evite ingerir más de 2,300 mg (miligramos) de sal (sodio) por día. Si tiene hipertensión, es posible que necesite reducir la ingesta de sodio a 1,500 mg por día.  · Limite el consumo de alcohol a no más de 1 medida por día si es mujer y no está embarazada, y 2 medidas por día si es hombre. Una medida equivale a 12 oz (355 ml) de cerveza, 5 oz (148 ml) de vino o 1½ oz (44 ml) de bebidas alcohólicas de alta graduación.  · Trabaje con su médico para mantener un peso saludable o perder peso. Pregúntele cuál es el peso recomendado para usted.  · Realice al menos 30 minutos de ejercicio que haga que se acelere su corazón (ejercicio aeróbico) la mayoría de los días de la semana. Estas actividades pueden incluir caminar, nadar o andar en bicicleta.  · Trabaje con su médico o especialista en alimentación y nutrición (nutricionista) para ajustar su plan alimentario a sus necesidades calóricas personales.  Lectura de las etiquetas de los alimentos  · Verifique en las etiquetas de los alimentos, la cantidad de sodio por porción. Elija alimentos con menos del 5 por ciento del valor diario de sodio. Generalmente, los alimentos con menos de 300 mg de sodio por porción se encuadran dentro de este plan alimentario.  · Para encontrar cereales integrales, busque la palabra "integral" como primera palabra en la lista de ingredientes.  De compras  · Compre productos en los que en su etiqueta diga: “bajo contenido de sodio” o “sin agregado de sal”.   · Compre alimentos frescos. Evite los alimentos enlatados y comidas precocidas o congeladas.  Cocción  · Evite agregar sal cuando cocine. Use hierbas o aderezos sin sal, en lugar de sal de mesa o sal marina. Consulte al médico o farmacéutico antes de usar sustitutos de la sal.  · No fría los alimentos. A la hora de cocinar los alimentos opte por hornearlos, hervirlos, grillarlos y asarlos a la parrilla.  · Cocine con aceites cardiosaludables, como oliva, canola, soja o girasol.  Planificación de las comidas    · Consuma una dieta equilibrada, que incluya lo siguiente:  ? 5 o más porciones de frutas y verduras por día. Trate de que la mitad del plato de cada comida sean frutas y verduras.  ? Hasta 6 u 8 porciones de cereales integrales por día.  ? Menos de 6 onzas de carne, aves o pescado magros por día. Una porción de 3 onzas de carne tiene casi el mismo tamaño que un mazo de cartas. Un huevo equivale a 1 onza.  ? Dos porciones de productos lácteos descremados por día.  ? Una porción de frutos secos, semillas o frijoles 5 veces por semana.  ? Grasas cardiosaludables. Las grasas saludables llamadas ácidos grasos omega-3 se encuentran en alimentos como semillas de lino y pescados de agua fría, como por ejemplo, sardinas, salmón y caballa.  · Limite la cantidad que ingiere de los siguientes alimentos:  ? Alimentos enlatados o envasados.  ? Alimentos con alto contenido de grasa trans, como alimentos fritos.  ? Alimentos con alto contenido de grasa saturada, como carne con   grasa.  ? Dulces, postres, bebidas azucaradas y otros alimentos con azúcar agregada.  ? Productos lácteos enteros.  · No le agregue sal a los alimentos antes de probarlos.  · Trate de comer al menos 2 comidas vegetarianas por semana.  · Consuma más comida casera y menos de restaurante, de bufés y comida rápida.  · Cuando coma en un restaurante, pida que preparen su comida con menos sal o, en lo posible, sin nada de sal.  ¿Qué alimentos se recomiendan?   Los alimentos enumerados a continuación no constituyen una lista completa. Hable con el nutricionista sobre las mejores opciones alimenticias para usted.  Cereales  Pan de salvado o integral. Pasta de salvado o integral. Arroz integral. Avena. Quinua. Trigo burgol. Cereales integrales y con bajo contenido de sodio. Pan pita. Galletitas de agua con bajo contenido de grasa y sodio. Tortillas de harina integral.  Verduras  Verduras frescas o congeladas (crudas, al vapor, asadas o grilladas). Jugos de tomate y verduras con bajo contenido de sodio o reducidos en sodio. Salsa y pasta de tomate con bajo contenido de sodio o reducidas en sodio. Verduras enlatadas con bajo contenido de sodio o reducidas en sodio.  Frutas  Todas las frutas frescas, congeladas o disecadas. Frutas enlatadas en jugo natural (sin agregado de azúcar).  Carne y otros alimentos proteicos  Pollo o pavo sin piel. Carne de pollo o de pavo molida. Cerdo desgrasado. Pescado y mariscos. Claras de huevo. Porotos, guisantes o lentejas secos. Frutos secos, mantequilla de frutos secos y semillas sin sal. Frijoles enlatados sin sal. Cortes de carne vacuna magra, desgrasada. Embutidos magros, con bajo contenido de sodio.  Lácteos  Leche descremada (1 %) o descremada. Quesos sin grasa, con bajo contenido de grasa o descremados. Queso blanco o ricota sin grasa, con bajo contenido de sodio. Yogur semidescremado o descremado. Queso con bajo contenido de grasa y sodio.  Grasas y aceites  Margarinas untables que no contengan grasas trans. Aceite vegetal. Mayonesa y aderezos para ensaladas livianos o con bajo contenido de grasas (reducidos en sodio). Aceite de canola, cártamo, oliva, soja y girasol. [place].  Condimentos y otros alimentos  Hierbas. Especias. Mezclas de condimentos sin sal. Palomitas de maíz y pretzels sin sal. Dulces con bajo contenido de grasas.  ¿Qué alimentos no se recomiendan?   Los alimentos enumerados a continuación no constituyen una lista completa. Hable con el nutricionista sobre las mejores opciones alimenticias para usted.  Cereales  Productos de panificación hechos con grasa, como medialunas, magdalenas y algunos panes. Comidas con arroz o pasta seca listas para usar.  Verduras  Verduras con crema o fritas. Verduras en salsa de queso. Verduras enlatadas regulares (que no sean con bajo contenido de sodio o reducidas en sodio). Pasta y salsa de tomates enlatadas regulares (que no sean con bajo contenido de sodio o reducidas en sodio). Jugos de tomate y verduras regulares (que no sean con bajo contenido de sodio o reducidos en sodio). Pepinillos. Aceitunas.  Frutas  Fruta enlatada en almíbar liviano o espeso. Frutas cocidas en aceite. Frutas con salsa de crema o manteca.  Carne y otros alimentos proteicos  Cortes de carne con grasa. Costillas. Carne frita. Tocino. Salchichas. Mortadela y otras carnes procesadas. Salame. Panceta. Perros calientes (hotdogs). Salchicha de cerdo. Frutos secos y semillas con sal. Frijoles enlatados con agregado de sal. Pescado enlatado o ahumado. Huevos enteros o yemas. Pollo o pavo con piel.  Lácteos  Leche entera o al 2 %, crema y mitad leche y mitad crema. Queso crema entero   o con toda su grasa. Yogur entero o endulzado. Quesos con toda su grasa. Sustitutos de cremas no lácteas. Coberturas batidas. Quesos para untar y quesos procesados.  Grasas y aceites  Mantequilla. Margarina en barra. Manteca de cerdo. Materia grasa. Mantequilla clarificada. Grasa de panceta. Aceites tropicales como aceite de coco, palmiste o palma.  Condimentos y otros alimentos  Palomitas de maíz y pretzels con sal. Sal de cebolla, sal de ajo, sal condimentada, sal de mesa y sal marina. Salsa Worcestershire. Salsa tártara. Salsa barbacoa. Salsa teriyaki. Salsa de soja, incluso la que tiene contenido reducido de sodio. Salsa de carne. Salsas en lata y  envasadas. Salsa de pescado. Salsa de ostras. Salsa rosada. Rábano picante envasado. [place]up. Mostaza. Saborizantes y tiernizantes para carne. Caldo en cubitos. Salsa picante y salsa tabasco. Escabeches envasados o ya preparados. Aderezos para tacos prefabricados o envasados. Salsas. Aderezos comunes para ensalada.  Dónde encontrar más información:  · Instituto Nacional del [place], los Pulmones y la Sangre (National Heart, Lung, and Blood Institute): www.nhlbi.nih.gov  · Asociación Estadounidense del [place] (American Heart Association): www.heart.org  Resumen  · El plan de alimentación DASH ha demostrado bajar la presión arterial elevada (hipertensión). También puede reducir el riesgo de diabetes tipo 2, enfermedad cardíaca y accidente cerebrovascular.  · Con el plan de alimentación DASH, deberá limitar el consumo de sal (sodio) a 2,300 mg por día. Si tiene hipertensión, es posible que necesite reducir la ingesta de sodio a 1,500 mg por día.  · Cuando siga el plan de alimentación DASH, trate de comer más frutas frescas y verduras, cereales integrales, carnes magras, lácteos descremados y grasas cardiosaludables.  · Trabaje con su médico o especialista en alimentación y nutrición (nutricionista) para ajustar su plan alimentario a sus necesidades calóricas personales.  Esta información no tiene como fin reemplazar el consejo del médico. Asegúrese de hacerle al médico cualquier pregunta que tenga.  Document Released: 04/09/2011 Document Revised: 08/10/2016 Document Reviewed: 08/10/2016  Elsevier Interactive Patient Education © 2018 Elsevier Inc.

## 2017-11-03 NOTE — Progress Notes (Signed)
Assessment & Plan:  [name] was seen today for follow-up and hyperlipidemia.  Diagnoses and all orders for this visit:  Essential hypertension -     Basic Metabolic Panel -     lisinopril-hydrochlorothiazide (PRINZIDE,ZESTORETIC) 20-25 MG tablet; Take 1 tablet by mouth daily. Continue all antihypertensives as prescribed.  Remember to bring in your blood pressure log with you for your follow up appointment.  DASH/Mediterranean Diets are healthier choices for HTN.   Mixed hyperlipidemia INSTRUCTIONS: Work on a low fat, heart healthy diet and participate in regular aerobic exercise program by working out at least 150 minutes per week. No fried foods. No junk foods, sodas, sugary drinks, unhealthy snacking, alcohol or smoking.     Colon cancer screening -     Fecal occult blood, imunochemical([facility])      Patient has been counseled on age-appropriate routine health concerns for screening and prevention. These are reviewed and up-to-date. Referrals have been placed accordingly. Immunizations are up-to-date or declined.    Subjective:   Chief Complaint  Patient presents with  . Follow-up    Pt. is here follow-up on hypertension.   . Hyperlipidemia   HPI Rita Zimmerman 72 y.o. female presents to office today   Essential Hypertension Chronic and not well controlled. Endorses medication compliance taking HCTZ 25mg . Will switch to Prinzide 20-25mg  and recheck BP in a few weeks. Denies chest pain, shortness of breath, palpitations, lightheadedness, dizziness, headaches or BLE edema.  BP Readings from Last 3 Encounters:  11/03/17 (!) 150/83  03/17/17 (!) 148/76  03/09/17 (!) 167/83    Hyperlipidemia Patient presents for follow up to hyperlipidemia.  She is medication compliant taking Lipitor 20mg . She is diet compliant and denies chest pain, exertional chest pressure/discomfort, fatigue, poor exercise tolerance and skin xanthelasma or statin intolerance including myalgias.  LDL not at goal. Lab Results  Component Value Date   CHOL 249 (H) 03/17/2017   Lab Results  Component Value Date   HDL 55 03/17/2017   Lab Results  Component Value Date   LDLCALC 168 (H) 03/17/2017   Lab Results  Component Value Date   TRIG 128 03/17/2017   Lab Results  Component Value Date   CHOLHDL 4.5 (H) 03/17/2017   No results found for: LDLDIRECT Review of Systems  Constitutional: Negative for fever, malaise/fatigue and weight loss.  HENT: Negative.  Negative for nosebleeds.   Eyes: Negative.  Negative for blurred vision, double vision and photophobia.  Respiratory: Negative.  Negative for cough and shortness of breath.   Cardiovascular: Negative.  Negative for chest pain, palpitations and leg swelling.  Gastrointestinal: Negative.  Negative for heartburn, nausea and vomiting.  Musculoskeletal: Negative.  Negative for myalgias.  Neurological: Negative.  Negative for dizziness, focal weakness, seizures and headaches.  Psychiatric/Behavioral: Negative.  Negative for suicidal ideas.    Past Medical History:  Diagnosis Date  . Hyperlipidemia   . Hypertension   . Osteoporosis     Past Surgical History:  Procedure Laterality Date  . BREAST EXCISIONAL BIOPSY Left     Family History  Problem Relation Age of Onset  . Cancer Mother     Social History Reviewed with no changes to be made today.   Outpatient Medications Prior to Visit  Medication Sig Dispense Refill  . aspirin EC 325 MG tablet Take 1 tablet (325 mg total) daily by mouth. 30 tablet 0  . atorvastatin (LIPITOR) 20 MG tablet Take 1 tablet (20 mg total) daily by mouth. 90 tablet 3  .  hydrochlorothiazide (HYDRODIURIL) 25 MG tablet Take 1 tablet (25 mg total) by mouth daily. 90 tablet 0   No facility-administered medications prior to visit.     Allergies  Allergen Reactions  . Gadolinium Derivatives Itching and Nausea And Vomiting  . Ibuprofen Swelling       Objective:    BP (!) 150/83 (BP  Location: Right Arm, Patient Position: Sitting, Cuff Size: Normal)   Pulse 76   Temp 98.4 F (36.9 C) (Oral)   Ht 4\' 9"  (1.448 m)   Wt 138 lb 12.8 oz (63 kg)   SpO2 95%   BMI 30.04 kg/m  Wt Readings from Last 3 Encounters:  11/03/17 138 lb 12.8 oz (63 kg)  03/17/17 141 lb 12.8 oz (64.3 kg)  03/09/17 142 lb 12.8 oz (64.8 kg)    Physical Exam  Constitutional: She is oriented to person, place, and time. She appears well-developed and well-nourished. She is cooperative.  HENT:  Head: Normocephalic and atraumatic.  Eyes: EOM are normal.  Neck: Normal range of motion.  Cardiovascular: Normal rate, regular rhythm, normal heart sounds and intact distal pulses. Exam reveals no gallop and no friction rub.  No murmur heard. Pulmonary/Chest: Effort normal and breath sounds normal. No tachypnea. No respiratory distress. She has no decreased breath sounds. She has no wheezes. She has no rhonchi. She has no rales. She exhibits no tenderness.  Abdominal: Soft. Bowel sounds are normal.  Musculoskeletal: Normal range of motion. She exhibits no edema.  Neurological: She is alert and oriented to person, place, and time. Coordination normal.  Skin: Skin is warm and dry.  Psychiatric: She has a normal mood and affect. Her behavior is normal. Judgment and thought content normal.  Nursing note and vitals reviewed.      Patient has been counseled extensively about nutrition and exercise as well as the importance of adherence with medications and regular follow-up. The patient was given clear instructions to go to ER or return to medical center if symptoms don't improve, worsen or new problems develop. The patient verbalized understanding.   Follow-up: Return in about 5 weeks (around 12/08/2017) for BP recheck.   Claiborne Rigg, FNP-BC [facility] [place], [place] [phone]   11/03/2017, 11:44 AM

## 2017-11-04 LAB — BASIC METABOLIC PANEL
BUN / CREAT RATIO: 22 (ref 12–28)
BUN: 21 mg/dL (ref 8–27)
CO2: 23 mmol/L (ref 20–29)
Calcium: 9.9 mg/dL (ref 8.7–10.3)
Chloride: 103 mmol/L (ref 96–106)
Creatinine, Ser: 0.97 mg/dL (ref 0.57–1.00)
GFR calc Af Amer: 68 mL/min/{1.73_m2} (ref >59)
GFR, EST NON AFRICAN AMERICAN: 59 mL/min/{1.73_m2} — AB (ref >59)
Glucose: 103 mg/dL — ABNORMAL HIGH (ref 65–99)
POTASSIUM: 4.2 mmol/L (ref 3.5–5.2)
SODIUM: 143 mmol/L (ref 134–144)

## 2017-11-08 IMAGING — MG DIGITAL SCREENING BILATERAL MAMMOGRAM WITH CAD
4 series · 4 of 4 positions shown · non-contrast
Comparison: Diagnostic spot-compression right breast images from
12/05/2013.

ADDENDUM:
Additional prior exam, a bilateral screening study dated 11/09/2013,
has become available for comparison. Since the prior exam, there has
been no interval change. There is no change to the current
impression, recommendation or BI-RADS category.
CLINICAL DATA: Screening.

EXAM:
DIGITAL SCREENING BILATERAL MAMMOGRAM WITH CAD

[R MLO]
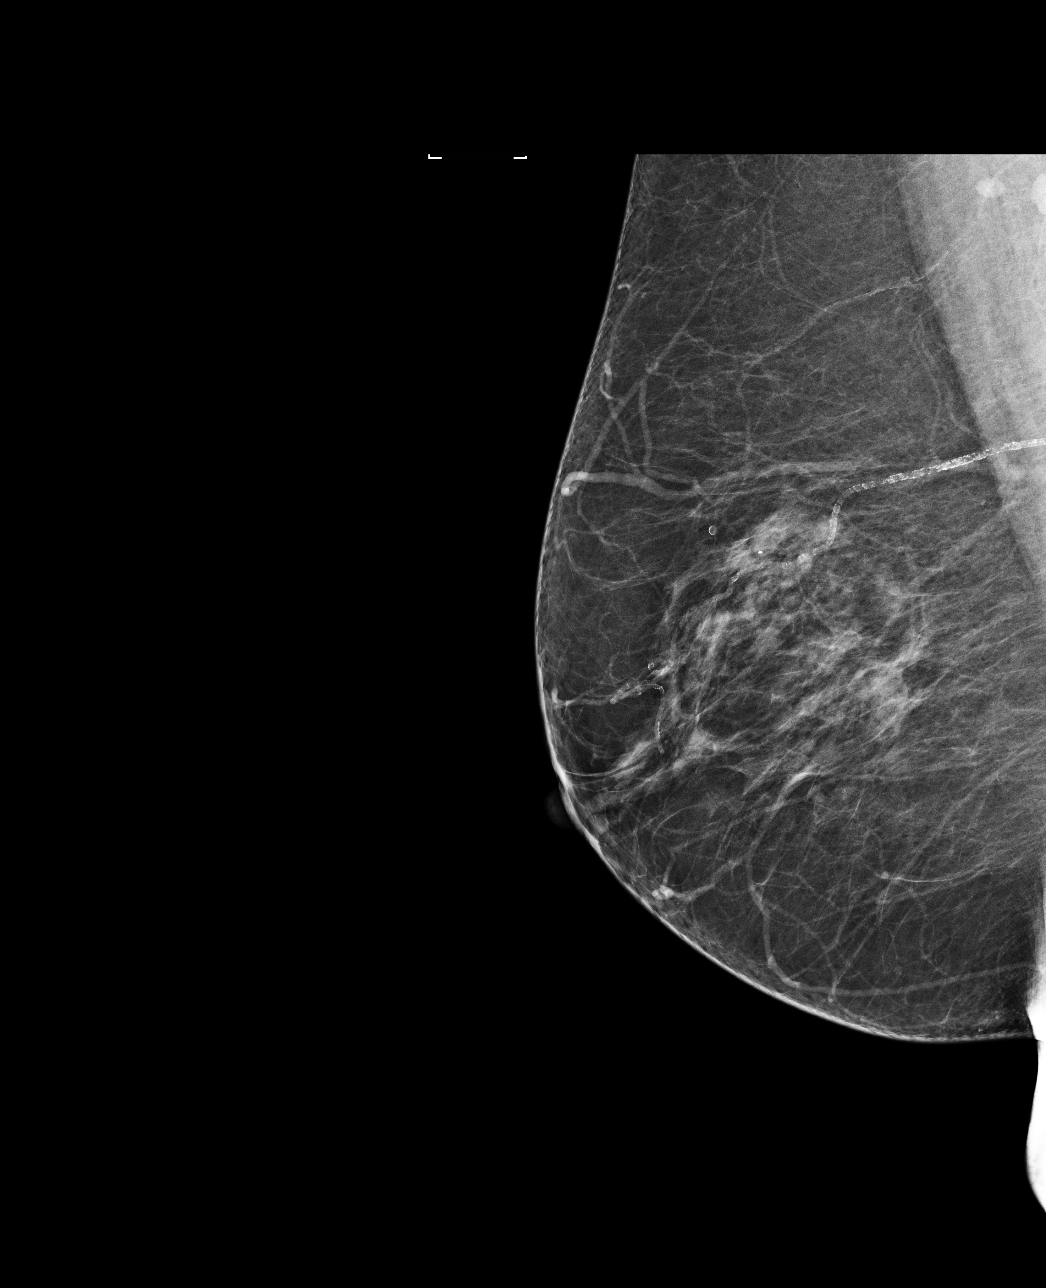

[L CC]
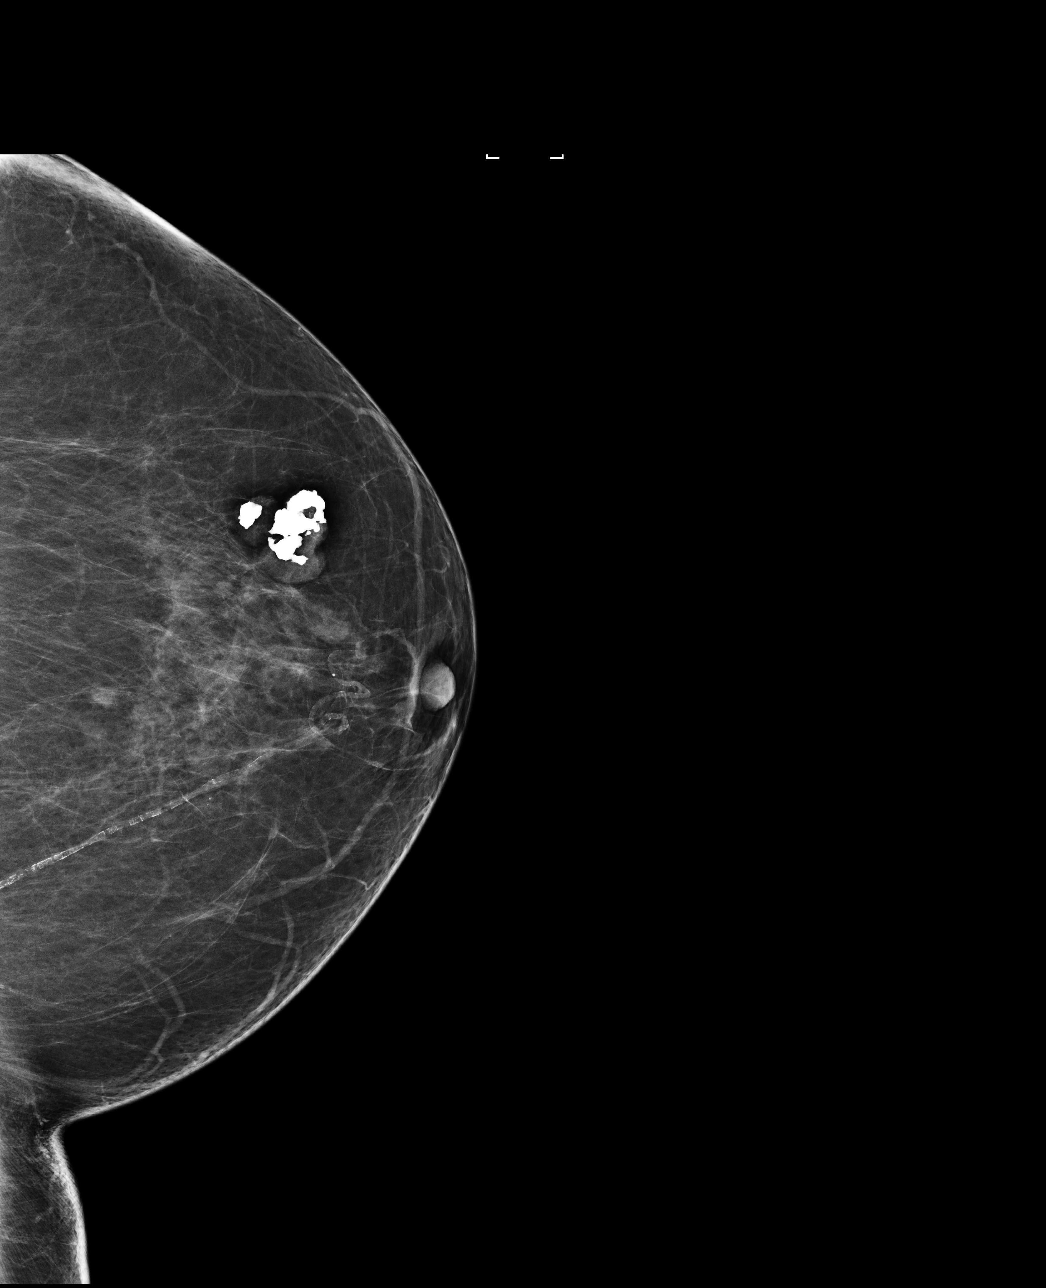

[L MLO]
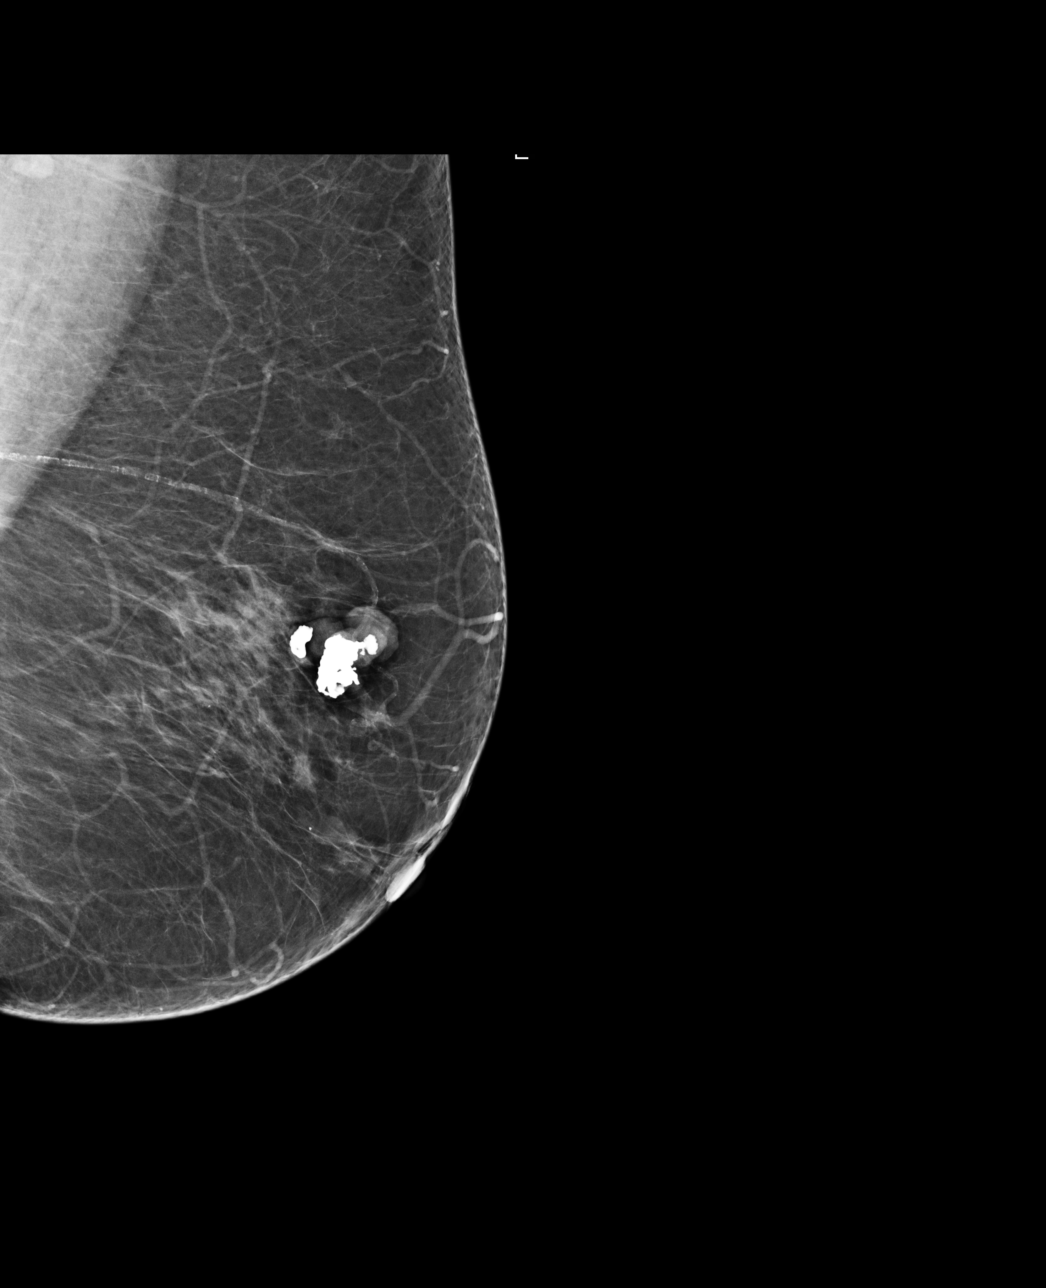

[R CC]
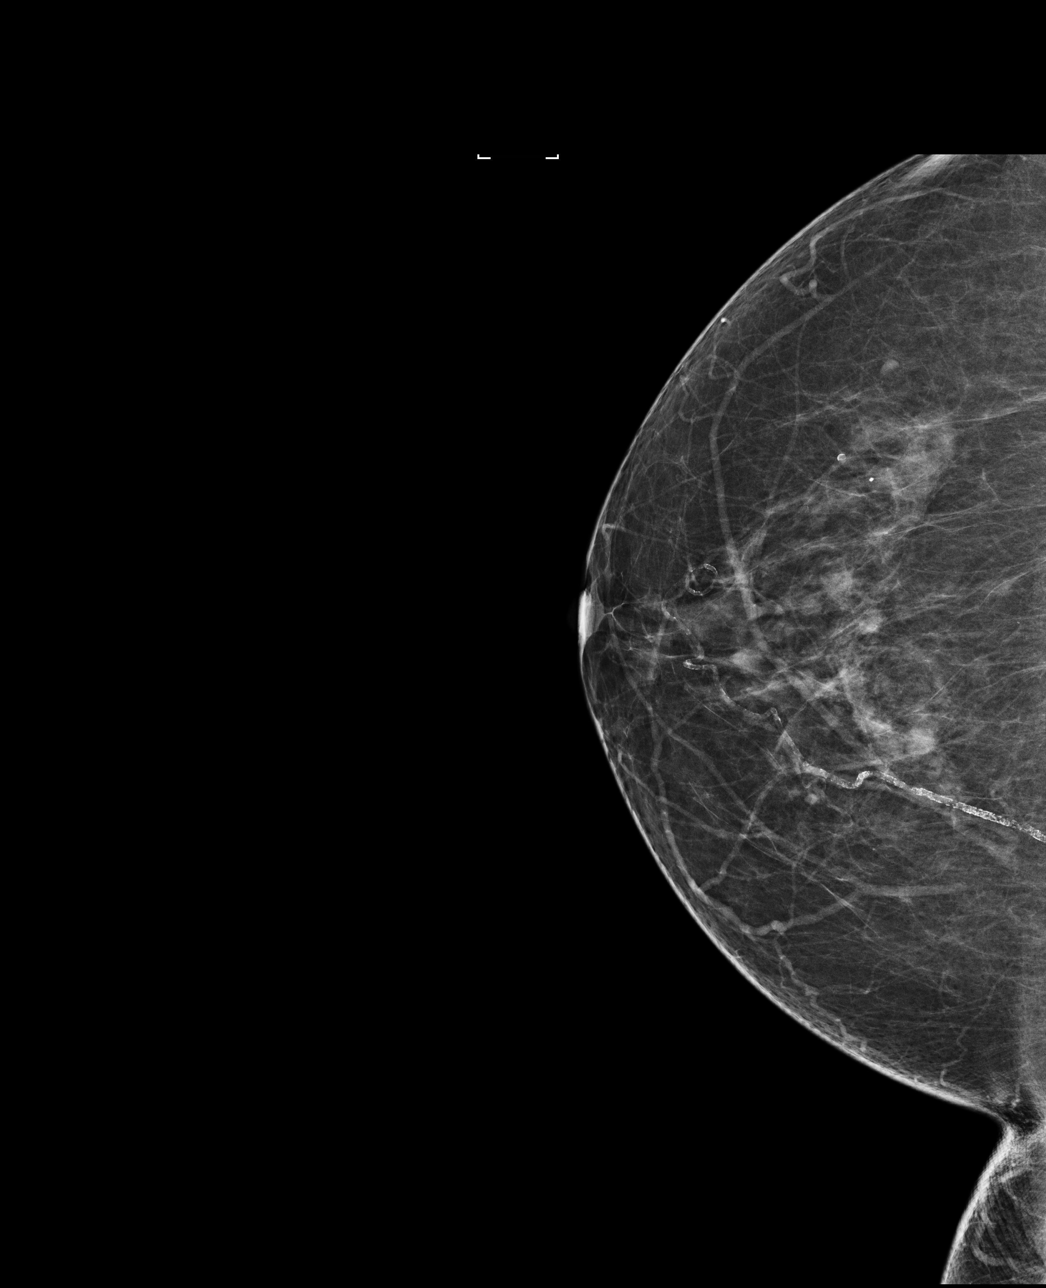

[4 of 4 positions shown; findings below may reference images not displayed]

No other prior exams.

ACR Breast Density Category b: There are scattered areas of
fibroglandular density.
FINDINGS: There are no findings suspicious for malignancy. Images were
processed with CAD.
IMPRESSION: No mammographic evidence of malignancy. A result letter of this
screening mammogram will be mailed directly to the patient.

RECOMMENDATION:
Screening mammogram in one year. (Code:IA-O-RBH)

BI-RADS CATEGORY  1: Negative.

## 2017-11-12 ENCOUNTER — Telehealth: Payer: Self-pay

## 2017-11-12 NOTE — Telephone Encounter (Signed)
CMA attempt to call patient to inform on lab results. No answer and left a VM for patient to call back.  If patient call back, please inform:  Your labs are fairly normal. Kidney function is normal.

## 2017-11-12 NOTE — Telephone Encounter (Signed)
-----   Message from Claiborne RiggZelda W Fleming, NP sent at 11/08/2017  9:04 PM EDT ----- Your labs are fairly normal. Kidney function is normal.

## 2017-11-12 NOTE — Progress Notes (Signed)
Spoke to the son, informed on lab results.

## 2017-12-08 ENCOUNTER — Encounter: Payer: Medicare Other | Admitting: Pharmacist

## 2018-02-04 ENCOUNTER — Ambulatory Visit: Payer: Medicare Other | Admitting: Nurse Practitioner

## 2020-09-29 ENCOUNTER — Encounter (HOSPITAL_COMMUNITY): Payer: Self-pay

## 2020-09-29 ENCOUNTER — Ambulatory Visit (HOSPITAL_COMMUNITY)
Admission: EM | Admit: 2020-09-29 | Discharge: 2020-09-29 | Disposition: A | Discharge status: Home / Self Care | Payer: Medicare (Managed Care)

## 2020-09-29 DIAGNOSIS — I10 Essential (primary) hypertension: Secondary | ICD-10-CM

## 2020-09-29 MED ORDER — HYDROCHLOROTHIAZIDE 25 MG PO TABS: 25.0000 mg | ORAL_TABLET | Freq: Every day | ORAL | 0 refills | Status: AC

## 2020-09-29 NOTE — ED Triage Notes (Signed)
Pt presents for medication refill. hydrochlorothiazide 25 mg.

## 2020-09-29 NOTE — ED Provider Notes (Signed)
[facility]    CSN: [id number] Arrival date & time: 09/29/20  1058      History   Chief Complaint Chief Complaint  Patient presents with  . Medication Refill    HPI Rita Zimmerman is a 75 y.o. female.   HPI her son is with her today who helps translate per pt request.  Essential Hypertension: Patient ran out of her HCTZ medication 2 days ago.  She continues to take her Lipitor.  She denies taking the lisinopril-hydrochlorothiazide medication.  She has noted some mild headache since having her blood pressure medication stopped as she ran out but otherwise feels well.  She is not having any chest pain, shortness of breath or peripheral edema.  She believes she is up-to-date on labs for this medication and is going back home to [place] soon or she has a follow-up with her primary care provider.  Past Medical History:  Diagnosis Date  . Hyperlipidemia   . Hypertension   . Osteoporosis     Patient Active Problem List   Diagnosis Date Noted  . Osteoporosis 03/17/2017  . TIA (transient ischemic attack) 03/09/2017  . Chronic daily headache 03/09/2017  . Tension type headache 02/05/2017  . Migraine 02/05/2017    Past Surgical History:  Procedure Laterality Date  . BREAST EXCISIONAL BIOPSY Left     OB History   No obstetric history on file.      Home Medications    Prior to Admission medications   Medication Sig Start Date End Date Taking? Authorizing Provider  alendronate (FOSAMAX) 70 MG tablet Take 70 mg by mouth once a week. Take with a full glass of water on an empty stomach.   Yes [provider]  Calcium Carbonate (CALCIUM 500 PO) Take by mouth.   Yes [provider]  aspirin EC 325 MG tablet Take 1 tablet (325 mg total) daily by mouth. 03/09/17   Anson Fret, MD  atorvastatin (LIPITOR) 20 MG tablet Take 1 tablet (20 mg total) daily by mouth. 03/19/17   Claiborne Rigg, NP  hydrochlorothiazide (HYDRODIURIL) 25 MG tablet Take 1  tablet (25 mg total) by mouth daily. 09/29/20   Rushie Chestnut, PA-C  lisinopril-hydrochlorothiazide (PRINZIDE,ZESTORETIC) 20-25 MG tablet Take 1 tablet by mouth daily. 11/03/17 09/29/20  Claiborne Rigg, NP    Family History Family History  Problem Relation Age of Onset  . Cancer Mother     Social History Social History   Tobacco Use  . Smoking status: Never Smoker  . Smokeless tobacco: Never Used  Vaping Use  . Vaping Use: Never used  Substance Use Topics  . Alcohol use: No  . Drug use: No     Allergies   Gadolinium derivatives and Ibuprofen   Review of Systems Review of Systems  As stated above in HPI Physical Exam Triage Vital Signs ED Triage Vitals  Enc Vitals Group     BP 09/29/20 1230 (!) 175/66     Pulse Rate 09/29/20 1230 (!) 57     Resp 09/29/20 1230 18     Temp 09/29/20 1230 98.2 F (36.8 C)     Temp Source 09/29/20 1230 Oral     SpO2 09/29/20 1230 99 %     Weight --      Height --      Head Circumference --      Peak Flow --      Pain Score 09/29/20 1225 0     Pain Loc --  Pain Edu? --      Excl. in GC? --    No data found.  Updated Vital Signs BP (!) 175/66 (BP Location: Right Arm) Comment: Pt took her medicatiosn 2 days ago.  Pulse (!) 57   Temp 98.2 F (36.8 C) (Oral)   Resp 18   SpO2 99%   Physical Exam Vitals and nursing note reviewed.  Constitutional:      Appearance: Normal appearance.  Neck:     Vascular: No carotid bruit.  Cardiovascular:     Rate and Rhythm: Normal rate and regular rhythm.     Pulses: Normal pulses.     Heart sounds: Normal heart sounds.  Pulmonary:     Effort: Pulmonary effort is normal.     Breath sounds: Normal breath sounds.  Musculoskeletal:     Right lower leg: No edema.     Left lower leg: No edema.  Neurological:     Mental Status: She is alert and oriented to person, place, and time.      UC Treatments / Results  Labs (all labs ordered are listed, but only abnormal results are  displayed) Labs Reviewed - No data to display  EKG   Radiology No results found.  Procedures Procedures (including critical care time)  Medications Ordered in UC Medications - No data to display  Initial Impression / Assessment and Plan / UC Course  I have reviewed the triage vital signs and the nursing notes.  Pertinent labs & imaging results that were available during my care of the patient were reviewed by me and considered in my medical decision making (see chart for details).    New to me.  As she has tolerated this medication well for quite some time and her blood pressure is likely to go down when she restarts this medication today I am going to call in this medication for her.  We discussed the importance of continued monitoring and for PCP follow-up.  She is agreeable.  Discussed red flag signs and symptoms. Final Clinical Impressions(s) / UC Diagnoses   Final diagnoses:  Essential hypertension   Discharge Instructions   None    ED Prescriptions    Medication Sig Dispense Auth. Provider   hydrochlorothiazide (HYDRODIURIL) 25 MG tablet Take 1 tablet (25 mg total) by mouth daily. 90 tablet [place] M, [place]     PDMP not reviewed this encounter.   Rushie Chestnut, [place] 09/29/20 1431

## 2021-10-06 ENCOUNTER — Ambulatory Visit
Admission: EM | Admit: 2021-10-06 | Discharge: 2021-10-06 | Disposition: A | Discharge status: Home / Self Care | Payer: Medicare (Managed Care) | Attending: Nurse Practitioner | Admitting: Nurse Practitioner

## 2021-10-06 ENCOUNTER — Encounter: Payer: Self-pay | Admitting: Emergency Medicine

## 2021-10-06 ENCOUNTER — Telehealth: Payer: Self-pay | Admitting: Nurse Practitioner

## 2021-10-06 DIAGNOSIS — L03116 Cellulitis of left lower limb: Secondary | ICD-10-CM | POA: Diagnosis not present

## 2021-10-06 MED ORDER — HYDROCODONE-ACETAMINOPHEN 5-325 MG PO TABS: 1.0000 | ORAL_TABLET | Freq: Once | ORAL | Status: DC

## 2021-10-06 MED ORDER — SULFAMETHOXAZOLE-TRIMETHOPRIM 800-160 MG PO TABS: 1.0000 | ORAL_TABLET | Freq: Two times a day (BID) | ORAL | 0 refills | Status: AC

## 2021-10-06 MED ORDER — HYDROCODONE-ACETAMINOPHEN 5-325 MG PO TABS: 1.0000 | ORAL_TABLET | ORAL | 0 refills | Status: AC | PRN

## 2021-10-06 MED ORDER — CEFTRIAXONE SODIUM 1 G IJ SOLR
1.0000 g | Freq: Once | INTRAMUSCULAR | Status: AC
  Administered 2021-10-06: 1 g via INTRAMUSCULAR

## 2021-10-06 MED ORDER — CLINDAMYCIN HCL 150 MG PO CAPS: 450.0000 mg | ORAL_CAPSULE | Freq: Three times a day (TID) | ORAL | 0 refills | Status: AC

## 2021-10-06 NOTE — Discharge Instructions (Addendum)
You have an infection in your foot called cellulitis. Cellulitis is a skin infection. The infected area is often warm, red, swollen, and sore. It occurs most often in the arms and lower legs. Please read the attached information to learn more about this condition.   You have been given a shot of antibiotics here You can wear the shoe if it helps  Take antibiotics as prescribed  Use prescribed pain medications only for severe pain  You may also take Tylenol in additional to the pain medication.  Please note that the pain medication has Tylenol in it as well. Make sure that you do not take more than 3000 mg of Tylenol within a 24-hour period.  Keep foot elevated to reduce swelling  Monitor for fevers, chills, vomiting or worsening symptoms  Go to ED if you get worse

## 2021-10-06 NOTE — ED Provider Notes (Signed)
[facility]    CSN: [id number] Arrival date & time: 10/06/21  1303      History   Chief Complaint Chief Complaint  Patient presents with   Foot Pain    HPI Rita Zimmerman is a 76 y.o. female.   History of Present Illness  [facility] #[id number]  Rita Zimmerman is a 76 y.o. female he patient complains of pain in the anterior aspect of the left 3rd toe and foot with radiation to foot. Onset of symptoms was abrupt starting 3 days ago. Patient denies any fall or injury. Patient describes pain as burning and throbbing. Pain severity now is 10 /10. Pain is aggravated by movement, weight bearing, and palpation. Pain is alleviated by rest. Patient denies any associated numbness, tingling, weakness, loss of sensation, loss of motion or inability to bear weight. Care prior to arrival consisted of acetaminophen, with no relief.          Past Medical History:  Diagnosis Date   Hyperlipidemia    Hypertension    Osteoporosis     Patient Active Problem List   Diagnosis Date Noted   Osteoporosis 03/17/2017   TIA (transient ischemic attack) 03/09/2017   Chronic daily headache 03/09/2017   Tension type headache 02/05/2017   Migraine 02/05/2017    Past Surgical History:  Procedure Laterality Date   BREAST EXCISIONAL BIOPSY Left     OB History   No obstetric history on file.      Home Medications    Prior to Admission medications   Medication Sig Start Date End Date Taking? Authorizing Provider  clindamycin (CLEOCIN) 150 MG capsule Take 3 capsules (450 mg total) by mouth 3 (three) times daily for 7 days. 10/06/21 10/13/21 Yes Lurline IdolMurrill, Jhovanny Guinta, FNP  HYDROcodone-acetaminophen (NORCO) 5-325 MG tablet Take 1 tablet by mouth every 4 (four) hours as needed for severe pain. 10/06/21  Yes Lurline IdolMurrill, Sojourner Behringer, FNP  sulfamethoxazole-trimethoprim (BACTRIM DS) 800-160 MG tablet Take 1 tablet by mouth 2 (two) times daily for 7 days. 10/06/21 10/13/21 Yes Lurline IdolMurrill, Zacharias Ridling,  FNP  alendronate (FOSAMAX) 70 MG tablet Take 70 mg by mouth once a week. Take with a full glass of water on an empty stomach.    [provider]  aspirin EC 325 MG tablet Take 1 tablet (325 mg total) daily by mouth. 03/09/17   Anson FretAhern, Antonia B, MD  atorvastatin (LIPITOR) 20 MG tablet Take 1 tablet (20 mg total) daily by mouth. 03/19/17   Claiborne RiggFleming, Zelda W, NP  Calcium Carbonate (CALCIUM 500 PO) Take by mouth.    [provider]  hydrochlorothiazide (HYDRODIURIL) 25 MG tablet Take 1 tablet (25 mg total) by mouth daily. 09/29/20   Rushie Chestnutovington, Sarah M, PA-C  lisinopril-hydrochlorothiazide (PRINZIDE,ZESTORETIC) 20-25 MG tablet Take 1 tablet by mouth daily. 11/03/17 09/29/20  Claiborne RiggFleming, Zelda W, NP    Family History Family History  Problem Relation Age of Onset   Cancer Mother     Social History Social History   Tobacco Use   Smoking status: Never   Smokeless tobacco: Never  Vaping Use   Vaping Use: Never used  Substance Use Topics   Alcohol use: No   Drug use: No     Allergies   Gadolinium derivatives and Ibuprofen   Review of Systems Review of Systems  Constitutional:  Negative for fever.  Gastrointestinal:  Negative for nausea and vomiting.  Musculoskeletal:  Positive for gait problem.  Neurological:  Negative for numbness.  All other systems reviewed and are  negative.   Physical Exam Triage Vital Signs ED Triage Vitals  Enc Vitals Group     BP 10/06/21 1332 (!) 147/56     Pulse Rate 10/06/21 1332 87     Resp 10/06/21 1332 20     Temp 10/06/21 1332 98.3 F (36.8 C)     Temp src --      SpO2 10/06/21 1332 98 %     Weight --      Height --      Head Circumference --      Peak Flow --      Pain Score 10/06/21 1330 4     Pain Loc --      Pain Edu? --      Excl. in GC? --    No data found.  Updated Vital Signs BP (!) 147/56   Pulse 87   Temp 98.3 F (36.8 C)   Resp 20   SpO2 98%   Visual Acuity Right Eye Distance:   Left Eye Distance:    Bilateral Distance:    Right Eye Near:   Left Eye Near:    Bilateral Near:     Physical Exam Vitals reviewed.  Constitutional:      Appearance: Normal appearance.  HENT:     Head: Normocephalic.  Cardiovascular:     Rate and Rhythm: Normal rate.  Pulmonary:     Effort: Pulmonary effort is normal.  Abdominal:     Palpations: Abdomen is soft.  Musculoskeletal:        General: Normal range of motion.     Cervical back: Normal range of motion.     Left foot: Normal range of motion.  Feet:     Left foot:     Skin integrity: Erythema and warmth present.     Toenail Condition: Left toenails are normal.     Comments: Erythema, warmth and swelling of the left third toe extending into the midfoot area.  Pedal pulses intact.  No obvious wound or defect in skin noted. Skin:    General: Skin is warm and dry.  Neurological:     General: No focal deficit present.     Mental Status: She is alert and oriented to person, place, and time.     UC Treatments / Results  Labs (all labs ordered are listed, but only abnormal results are displayed) Labs Reviewed - No data to display  EKG   Radiology No results found.  Procedures Procedures (including critical care time)  Medications Ordered in UC Medications  cefTRIAXone (ROCEPHIN) injection 1 g (1 g Intramuscular Given 10/06/21 1443)    Initial Impression / Assessment and Plan / UC Course  I have reviewed the triage vital signs and the nursing notes.  Pertinent labs & imaging results that were available during my care of the patient were reviewed by me and considered in my medical decision making (see chart for details).    76 year old female presenting with probable acute cellulitis of the left foot.  She has pain, swelling and erythema within the third toe that extends to the midfoot area.  She is afebrile and nontoxic.  He has been placed in a postop shoe for comfort.  1 g of Rocephin IM given in clinic.  Patient placed on a 1  week course of clindamycin and Bactrim.  Short course of Norco provided for the pain.  Patient advised that she can take Tylenol for mild to moderate pain but was also advised to maintain less  than 3000 mg of acetaminophen in a 24-hour period.  Reviewed diagnoses, treatment and indications for follow-up with patient and daughter.  Today's evaluation has revealed no signs of a dangerous process. Discussed diagnosis with patient and/or guardian. Patient and/or guardian aware of their diagnosis, possible red flag symptoms to watch out for and need for close follow up. Patient and/or guardian understands verbal and written discharge instructions. Patient and/or guardian comfortable with plan and disposition.  Patient and/or guardian has a clear mental status at this time, good insight into illness (after discussion and teaching) and has clear judgment to make decisions regarding their care  Documentation was completed with the aid of voice recognition software. Transcription may contain typographical errors. Final Clinical Impressions(s) / UC Diagnoses   Final diagnoses:  Cellulitis of foot, left     Discharge Instructions      You have an infection in your foot called cellulitis. Cellulitis is a skin infection. The infected area is often warm, red, swollen, and sore. It occurs most often in the arms and lower legs. Please read the attached information to learn more about this condition.   You have been given a shot of antibiotics here You can wear the shoe if it helps  Take antibiotics as prescribed  Use prescribed pain medications only for severe pain  You may also take Tylenol in additional to the pain medication.  Please note that the pain medication has Tylenol in it as well. Make sure that you do not take more than 3000 mg of Tylenol within a 24-hour period.  Keep foot elevated to reduce swelling  Monitor for fevers, chills, vomiting or worsening symptoms  Go to ED if you get worse       ED Prescriptions     Medication Sig Dispense Auth. Provider   clindamycin (CLEOCIN) 150 MG capsule Take 3 capsules (450 mg total) by mouth 3 (three) times daily for 7 days. 63 capsule Lurline Idol, FNP   sulfamethoxazole-trimethoprim (BACTRIM DS) 800-160 MG tablet Take 1 tablet by mouth 2 (two) times daily for 7 days. 14 tablet Lurline Idol, FNP   HYDROcodone-acetaminophen (NORCO) 5-325 MG tablet Take 1 tablet by mouth every 4 (four) hours as needed for severe pain. 12 tablet Lurline Idol, FNP      I have reviewed the PDMP during this encounter.   Lurline Idol, [place] 10/06/21 1752

## 2021-10-06 NOTE — ED Triage Notes (Signed)
Pt here with left bottom foot pain and on top of toes with in intense burning in entire foot x 4 days.
